# Patient Record
Sex: Female | Born: 1955
Health system: Southern US, Community
[De-identification: ages and names within clinical notes are randomized; demographics above are authoritative.]

## PROBLEM LIST (undated history)

## (undated) DIAGNOSIS — S62102A Fracture of unspecified carpal bone, left wrist, initial encounter for closed fracture: Secondary | ICD-10-CM

## (undated) DIAGNOSIS — T7840XA Allergy, unspecified, initial encounter: Secondary | ICD-10-CM

## (undated) DIAGNOSIS — E039 Hypothyroidism, unspecified: Secondary | ICD-10-CM

## (undated) DIAGNOSIS — L309 Dermatitis, unspecified: Secondary | ICD-10-CM

## (undated) HISTORY — DX: Dermatitis, unspecified: L30.9

## (undated) HISTORY — PX: ANKLE SURGERY: SHX546

## (undated) HISTORY — PX: TUBAL LIGATION: SHX77

## (undated) HISTORY — DX: Hypothyroidism, unspecified: E03.9

## (undated) HISTORY — DX: Allergy, unspecified, initial encounter: T78.40XA

---

## 1999-01-20 ENCOUNTER — Ambulatory Visit (HOSPITAL_COMMUNITY): Admission: RE | Admit: 1999-01-20 | Discharge: 1999-01-21 | Payer: Self-pay | Admitting: Orthopaedic Surgery

## 1999-12-31 ENCOUNTER — Encounter: Admission: RE | Admit: 1999-12-31 | Discharge: 1999-12-31 | Payer: Self-pay | Admitting: Internal Medicine

## 1999-12-31 ENCOUNTER — Encounter: Payer: Self-pay | Admitting: Internal Medicine

## 2001-01-05 ENCOUNTER — Encounter: Admission: RE | Admit: 2001-01-05 | Discharge: 2001-01-05 | Payer: Self-pay | Admitting: Internal Medicine

## 2001-01-05 ENCOUNTER — Encounter: Payer: Self-pay | Admitting: Internal Medicine

## 2001-03-02 ENCOUNTER — Other Ambulatory Visit: Admission: RE | Admit: 2001-03-02 | Discharge: 2001-03-02 | Payer: Self-pay | Admitting: Internal Medicine

## 2002-02-01 ENCOUNTER — Encounter: Admission: RE | Admit: 2002-02-01 | Discharge: 2002-02-01 | Payer: Self-pay | Admitting: Internal Medicine

## 2002-02-01 ENCOUNTER — Encounter: Payer: Self-pay | Admitting: Internal Medicine

## 2003-06-27 ENCOUNTER — Encounter: Admission: RE | Admit: 2003-06-27 | Discharge: 2003-06-27 | Payer: Self-pay | Admitting: Internal Medicine

## 2004-01-03 ENCOUNTER — Other Ambulatory Visit: Admission: RE | Admit: 2004-01-03 | Discharge: 2004-01-03 | Payer: Self-pay | Admitting: Obstetrics and Gynecology

## 2004-06-27 ENCOUNTER — Ambulatory Visit (HOSPITAL_COMMUNITY): Admission: RE | Admit: 2004-06-27 | Discharge: 2004-06-27 | Payer: Self-pay | Admitting: Internal Medicine

## 2004-09-08 ENCOUNTER — Ambulatory Visit: Payer: Self-pay | Admitting: Internal Medicine

## 2005-01-05 ENCOUNTER — Ambulatory Visit: Payer: Self-pay | Admitting: Internal Medicine

## 2005-01-24 ENCOUNTER — Ambulatory Visit: Payer: Self-pay | Admitting: Family Medicine

## 2005-02-11 ENCOUNTER — Other Ambulatory Visit: Admission: RE | Admit: 2005-02-11 | Discharge: 2005-02-11 | Payer: Self-pay | Admitting: Obstetrics and Gynecology

## 2005-04-23 ENCOUNTER — Ambulatory Visit: Payer: Self-pay | Admitting: Internal Medicine

## 2005-05-18 ENCOUNTER — Ambulatory Visit: Payer: Self-pay | Admitting: Internal Medicine

## 2005-06-29 ENCOUNTER — Ambulatory Visit (HOSPITAL_COMMUNITY): Admission: RE | Admit: 2005-06-29 | Discharge: 2005-06-29 | Payer: Self-pay | Admitting: Internal Medicine

## 2005-07-24 ENCOUNTER — Ambulatory Visit: Payer: Self-pay | Admitting: Internal Medicine

## 2005-07-24 ENCOUNTER — Encounter: Payer: Self-pay | Admitting: Family Medicine

## 2005-12-25 ENCOUNTER — Ambulatory Visit: Payer: Self-pay | Admitting: Internal Medicine

## 2006-06-30 ENCOUNTER — Ambulatory Visit (HOSPITAL_COMMUNITY): Admission: RE | Admit: 2006-06-30 | Discharge: 2006-06-30 | Payer: Self-pay | Admitting: Internal Medicine

## 2007-05-13 ENCOUNTER — Ambulatory Visit: Payer: Self-pay | Admitting: Internal Medicine

## 2007-07-04 ENCOUNTER — Ambulatory Visit (HOSPITAL_COMMUNITY): Admission: RE | Admit: 2007-07-04 | Discharge: 2007-07-04 | Payer: Self-pay | Admitting: Internal Medicine

## 2008-01-28 ENCOUNTER — Ambulatory Visit: Payer: Self-pay | Admitting: Family Medicine

## 2008-01-30 ENCOUNTER — Telehealth: Payer: Self-pay | Admitting: Internal Medicine

## 2008-02-20 ENCOUNTER — Ambulatory Visit: Payer: Self-pay | Admitting: Internal Medicine

## 2008-02-20 DIAGNOSIS — H60399 Other infective otitis externa, unspecified ear: Secondary | ICD-10-CM | POA: Insufficient documentation

## 2008-02-20 DIAGNOSIS — J309 Allergic rhinitis, unspecified: Secondary | ICD-10-CM | POA: Insufficient documentation

## 2008-02-20 DIAGNOSIS — M810 Age-related osteoporosis without current pathological fracture: Secondary | ICD-10-CM

## 2008-03-05 ENCOUNTER — Ambulatory Visit: Payer: Self-pay | Admitting: Internal Medicine

## 2008-03-05 DIAGNOSIS — J069 Acute upper respiratory infection, unspecified: Secondary | ICD-10-CM | POA: Insufficient documentation

## 2008-04-13 ENCOUNTER — Ambulatory Visit: Payer: Self-pay | Admitting: Internal Medicine

## 2008-04-26 ENCOUNTER — Telehealth: Payer: Self-pay | Admitting: Internal Medicine

## 2008-10-05 ENCOUNTER — Ambulatory Visit: Payer: Self-pay | Admitting: Family Medicine

## 2008-10-05 DIAGNOSIS — H60541 Acute eczematoid otitis externa, right ear: Secondary | ICD-10-CM | POA: Insufficient documentation

## 2008-10-25 ENCOUNTER — Ambulatory Visit: Payer: Self-pay | Admitting: Family Medicine

## 2008-10-25 DIAGNOSIS — H01009 Unspecified blepharitis unspecified eye, unspecified eyelid: Secondary | ICD-10-CM | POA: Insufficient documentation

## 2009-02-22 ENCOUNTER — Ambulatory Visit: Payer: Self-pay | Admitting: Family Medicine

## 2009-05-28 ENCOUNTER — Encounter (INDEPENDENT_AMBULATORY_CARE_PROVIDER_SITE_OTHER): Payer: Self-pay | Admitting: *Deleted

## 2009-06-19 ENCOUNTER — Encounter (INDEPENDENT_AMBULATORY_CARE_PROVIDER_SITE_OTHER): Payer: Self-pay | Admitting: *Deleted

## 2009-07-12 ENCOUNTER — Encounter (INDEPENDENT_AMBULATORY_CARE_PROVIDER_SITE_OTHER): Payer: Self-pay | Admitting: *Deleted

## 2009-07-15 ENCOUNTER — Ambulatory Visit: Payer: Self-pay | Admitting: Gastroenterology

## 2009-09-16 ENCOUNTER — Encounter (INDEPENDENT_AMBULATORY_CARE_PROVIDER_SITE_OTHER): Payer: Self-pay | Admitting: *Deleted

## 2009-09-16 ENCOUNTER — Ambulatory Visit: Payer: Self-pay | Admitting: Internal Medicine

## 2009-09-30 ENCOUNTER — Ambulatory Visit: Payer: Self-pay | Admitting: Internal Medicine

## 2009-09-30 HISTORY — PX: COLONOSCOPY: SHX174

## 2010-04-25 ENCOUNTER — Ambulatory Visit: Payer: Self-pay | Admitting: Family Medicine

## 2010-04-25 DIAGNOSIS — J209 Acute bronchitis, unspecified: Secondary | ICD-10-CM

## 2010-09-04 NOTE — Miscellaneous (Signed)
Summary: [facility] PV prep  Clinical Lists Changes  Observations: Added new observation of ALLERGY REV: Done (09/16/2009 10:46)  Pt had a previously scheduled colonoscopy that she cancelled and did not use MoviPrep.  She will use prep she already has at home.

## 2010-09-04 NOTE — Procedures (Signed)
Summary: Colonoscopy  Patient: Christy Douglas Note: All result statuses are Final unless otherwise noted.  Tests: (1) Colonoscopy (COL)   COL Colonoscopy           DONE     [facility]     [address]     [place], [place]  [address]           COLONOSCOPY PROCEDURE REPORT           PATIENT:  [name], [name]  MR#:  [id number]     BIRTHDATE:  02-21-1956, 53 yrs. old  GENDER:  female           ENDOSCOPIST:  Hedwig Morton. Juanda Chance, MD     Referred by:  Tera Mater Clent Ridges, M.D.           PROCEDURE DATE:  09/30/2009     PROCEDURE:  Colonoscopy 295[address]     ASA CLASS:  Class I     INDICATIONS:  Routine Risk Screening           MEDICATIONS:   There was residual sedation effect present from     prior procedure. 10 mg, Fentanyl 100 mcg           DESCRIPTION OF PROCEDURE:   After the risks benefits and     alternatives of the procedure were thoroughly explained, informed     consent was obtained.  No rectal exam performed. The LB CF-H180AL     [id number] endoscope was introduced through the anus and advanced to     the cecum, which was identified by both the appendix and ileocecal     valve, without limitations.  The quality of the prep was good,     using MiraLax.  The instrument was then slowly withdrawn as the     colon was fully examined.     <<PROCEDUREIMAGES>>           FINDINGS:  No polyps or cancers were seen (see image1, image2, and     image3).   Retroflexed views in the rectum revealed no     abnormalities.    The scope was then withdrawn from the patient     and the procedure completed.           COMPLICATIONS:  None           ENDOSCOPIC IMPRESSION:     1) No polyps or cancers     2) Normal colonoscopy     RECOMMENDATIONS:     1) High fiber diet.           REPEAT EXAM:  In 10 year(s) for.           ______________________________     Hedwig Morton. Juanda Chance, MD           CC:           n.     eSIGNED:   Hedwig Morton. Nabria Nevin at 09/30/2009 10:05 AM           Lillard Anes, [id number]  Note:  An exclamation mark (!) indicates a result that was not dispersed into the flowsheet. Document Creation Date: 09/30/2009 10:06 AM _______________________________________________________________________  (1) Order result status: Final Collection or observation date-time: 09/30/2009 09:56 Requested date-time:  Receipt date-time:  Reported date-time:  Referring Physician:   Ordering Physician: Lina Sar [id number]) Specimen Source:  Source: Launa Grill Order Number: [id number] Lab site:   Appended Document: Colonoscopy    Clinical Lists Changes  Observations: Added new observation  of COLONNXTDUE: 09/2019 (09/30/2009 10:45)

## 2010-09-04 NOTE — Letter (Signed)
Summary: [facility] Instructions  [facility]  [address] [place], [place] [address]   Phone: [phone]  Fax: [phone]       Christy Douglas    Mar 17, 1956    MRN: [id number]        Procedure Day /Date:  Monday 09/30/2009     Arrival Time: 8:30 am      Procedure Time: 9:30 am     Location of Procedure:                    _ x_  [facility] (4th Floor)                        PREPARATION FOR COLONOSCOPY WITH MOVIPREP   Starting 5 days prior to your procedure Wednesday 2/23 do not eat nuts, seeds, popcorn, corn, beans, peas,  salads, or any raw vegetables.  Do not take any fiber supplements (e.g. Metamucil, Citrucel, and Benefiber).  THE DAY BEFORE YOUR PROCEDURE         DATE: Sunday 2/27  1.  Drink clear liquids the entire day-NO SOLID FOOD  2.  Do not drink anything colored red or purple.  Avoid juices with pulp.  No orange juice.  3.  Drink at least 64 oz. (8 glasses) of fluid/clear liquids during the day to prevent dehydration and help the prep work efficiently.  CLEAR LIQUIDS INCLUDE: Water Jello Ice Popsicles Tea (sugar ok, no milk/cream) Powdered fruit flavored drinks Coffee (sugar ok, no milk/cream) Gatorade Juice: apple, white grape, white cranberry  Lemonade Clear bullion, consomm, broth Carbonated beverages (any kind) Strained chicken noodle soup Hard Candy                             4.  In the morning, mix first dose of MoviPrep solution:    Empty 1 Pouch A and 1 Pouch B into the disposable container    Add lukewarm drinking water to the top line of the container. Mix to dissolve    Refrigerate (mixed solution should be used within 24 hrs)  5.  Begin drinking the prep at 5:00 p.m. The MoviPrep container is divided by 4 marks.   Every 15 minutes drink the solution down to the next mark (approximately 8 oz) until the full liter is complete.   6.  Follow completed prep with 16 oz of clear liquid of your choice (Nothing red  or purple).  Continue to drink clear liquids until bedtime.  7.  Before going to bed, mix second dose of MoviPrep solution:    Empty 1 Pouch A and 1 Pouch B into the disposable container    Add lukewarm drinking water to the top line of the container. Mix to dissolve    Refrigerate  THE DAY OF YOUR PROCEDURE      DATE: Monday 2/28  Beginning at 4:30 am (5 hours before procedure):         1. Every 15 minutes, drink the solution down to the next mark (approx 8 oz) until the full liter is complete.  2. Follow completed prep with 16 oz. of clear liquid of your choice.    3. You may drink clear liquids until 7:j30 am (2 HOURS BEFORE PROCEDURE).   MEDICATION INSTRUCTIONS  Unless otherwise instructed, you should take regular prescription medications with a small sip of water   as early as possible the morning of  your procedure.  Diabetic patients - see separate instructions.  Stop taking Plavix or Aggrenox on  _  _  (7 days before procedure).     Stop taking Coumadin on  _ _  (5 days before procedure).  Additional medication instructions: _         OTHER INSTRUCTIONS  You will need a responsible adult at least 53 years of age to accompany you and drive you home.   This person must remain in the waiting room during your procedure.  Wear loose fitting clothing that is easily removed.  Leave jewelry and other valuables at home.  However, you may wish to bring a book to read or  an iPod/MP3 player to listen to music as you wait for your procedure to start.  Remove all body piercing jewelry and leave at home.  Total time from sign-in until discharge is approximately 2-3 hours.  You should go home directly after your procedure and rest.  You can resume normal activities the  day after your procedure.  The day of your procedure you should not:   Drive   Make legal decisions   Operate machinery   Drink alcohol   Return to work  You will receive specific  instructions about eating, activities and medications before you leave.    The above instructions have been reviewed and explained to me by   _______________________    I fully understand and can verbalize these instructions _____________________________ Date _________
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
## 2010-09-04 NOTE — Letter (Signed)
Summary: [employer] / [employer] [employer] / [employer]   Imported By: Lennie Odor 07/22/2010 15:21:40  _____________________________________________________________________  External Attachment:    Type:   Image     Comment:   External Document

## 2011-01-02 ENCOUNTER — Encounter: Payer: Self-pay | Admitting: Family Medicine

## 2011-01-02 ENCOUNTER — Ambulatory Visit (INDEPENDENT_AMBULATORY_CARE_PROVIDER_SITE_OTHER): Payer: Medicare Other | Admitting: Family Medicine

## 2011-01-02 VITALS — BP 118/78 | Temp 98.5°F | Ht 64.0 in | Wt 168.0 lb

## 2011-01-02 DIAGNOSIS — H6120 Impacted cerumen, unspecified ear: Secondary | ICD-10-CM

## 2011-01-02 DIAGNOSIS — H60399 Other infective otitis externa, unspecified ear: Secondary | ICD-10-CM

## 2011-01-02 DIAGNOSIS — H6090 Unspecified otitis externa, unspecified ear: Secondary | ICD-10-CM

## 2011-01-02 MED ORDER — NEOMYCIN-POLYMYXIN-HC 3.5-10000-1 OT SUSP: 4.0000 [drp] | Freq: Four times a day (QID) | OTIC | Status: AC

## 2011-01-02 NOTE — Progress Notes (Signed)
  Subjective:    Patient ID: Christy Douglas, female    DOB: 25-Jan-1956, 55 y.o.   MRN: [id number]  HPI She went swimming 4 days ago as she usually does, but immediately after that she felt her right ear stop up. She has felt pressure in it ever since, and her hearing is muffled. No pain or fever.    Review of Systems  Constitutional: Negative.   HENT: Positive for hearing loss, congestion and sinus pressure. Negative for ear pain.   Eyes: Negative.   Respiratory: Negative.        Objective:   Physical Exam  Constitutional: She appears well-developed and well-nourished.  HENT:  Left Ear: External ear normal.  Nose: Nose normal.  Mouth/Throat: Oropharynx is clear and moist. No oropharyngeal exudate.       The right ear canal is full of cerumen   Eyes: Conjunctivae are normal. Pupils are equal, round, and reactive to light.  Neck: Normal range of motion. Neck supple. No thyromegaly present.  Pulmonary/Chest: Effort normal and breath sounds normal.  Lymphadenopathy:    She has no cervical adenopathy.          Assessment & Plan:  Stay out of the pool for one week.

## 2011-01-29 ENCOUNTER — Encounter: Payer: Self-pay | Admitting: Family Medicine

## 2011-01-29 ENCOUNTER — Ambulatory Visit (INDEPENDENT_AMBULATORY_CARE_PROVIDER_SITE_OTHER): Payer: Medicare Other | Admitting: Family Medicine

## 2011-01-29 VITALS — BP 110/80 | Temp 98.0°F

## 2011-01-29 DIAGNOSIS — H6090 Unspecified otitis externa, unspecified ear: Secondary | ICD-10-CM

## 2011-01-29 DIAGNOSIS — H60399 Other infective otitis externa, unspecified ear: Secondary | ICD-10-CM

## 2011-01-29 MED ORDER — NEOMYCIN-POLYMYXIN-HC 3.5-10000-1 OT SUSP: 4.0000 [drp] | Freq: Four times a day (QID) | OTIC | Status: DC

## 2011-01-29 NOTE — Progress Notes (Signed)
  Subjective:    Patient ID: Christy Douglas, female    DOB: 10-07-1955, 55 y.o.   MRN: [id number]  HPI Here for 2 days of pain in the left ear. She was here 4 weeks ago for the same problem in the right ear. This resolved with Cortisporin Otic. She still swims 4 days a week. No fever.    Review of Systems  Constitutional: Negative.   HENT: Positive for ear pain. Negative for hearing loss, congestion, sneezing, neck pain, neck stiffness, postnasal drip, sinus pressure and ear discharge.   Respiratory: Negative.        Objective:   Physical Exam  Constitutional: She appears well-developed and well-nourished.  HENT:  Head: Normocephalic and atraumatic.  Right Ear: External ear normal.  Nose: Nose normal.  Mouth/Throat: Oropharynx is clear and moist. No oropharyngeal exudate.  Eyes: Conjunctivae are normal. Pupils are equal, round, and reactive to light.       The left ear canal is red and swollen. The TM is clear   Neck: No thyromegaly present.  Lymphadenopathy:    She has no cervical adenopathy.          Assessment & Plan:  Use Cortisporin Otic again. No swimming for one week.

## 2011-03-09 ENCOUNTER — Telehealth: Payer: Self-pay | Admitting: *Deleted

## 2011-03-09 NOTE — Telephone Encounter (Signed)
Cal in one double pack of EpiPens to use prn

## 2011-03-09 NOTE — Telephone Encounter (Signed)
Would like an Epipen to take to [place] with her as she had mutltiple allergies, and is nervous about being in a different area, etc.

## 2011-03-10 MED ORDER — EPINEPHRINE 0.3 MG/0.3ML IJ DEVI: 0.3000 mg | Freq: Once | INTRAMUSCULAR | Status: DC

## 2011-03-10 NOTE — Telephone Encounter (Signed)
rx efiled to [facility] and pt aware

## 2011-09-09 ENCOUNTER — Encounter: Payer: Self-pay | Admitting: Family Medicine

## 2011-09-09 ENCOUNTER — Ambulatory Visit (INDEPENDENT_AMBULATORY_CARE_PROVIDER_SITE_OTHER): Payer: Medicare Other | Admitting: Family Medicine

## 2011-09-09 VITALS — BP 112/70 | HR 82 | Temp 98.6°F | Wt 164.0 lb

## 2011-09-09 DIAGNOSIS — J329 Chronic sinusitis, unspecified: Secondary | ICD-10-CM

## 2011-09-09 MED ORDER — AZITHROMYCIN 250 MG PO TABS: ORAL_TABLET | ORAL | Status: AC

## 2011-09-09 NOTE — Progress Notes (Signed)
  Subjective:    Patient ID: Christy Douglas, female    DOB: 05-08-1956, 56 y.o.   MRN: [id number]  HPI Here for 10 days of sinus pressure, PND, ST, and a dry cough. No fever.    Review of Systems  Constitutional: Negative.   HENT: Positive for congestion, postnasal drip and sinus pressure.   Respiratory: Positive for cough.        Objective:   Physical Exam  Constitutional: She appears well-developed and well-nourished.  HENT:  Right Ear: External ear normal.  Left Ear: External ear normal.  Nose: Nose normal.  Mouth/Throat: Oropharynx is clear and moist. No oropharyngeal exudate.  Eyes: Conjunctivae and EOM are normal.  Neck: No thyromegaly present.  Pulmonary/Chest: Effort normal and breath sounds normal.  Lymphadenopathy:    She has no cervical adenopathy.          Assessment & Plan:  Add Mucinex and drink water

## 2011-10-02 ENCOUNTER — Telehealth: Payer: Self-pay | Admitting: Family Medicine

## 2011-10-02 MED ORDER — TACROLIMUS 0.1 % EX OINT: TOPICAL_OINTMENT | Freq: Two times a day (BID) | CUTANEOUS | Status: DC

## 2011-10-02 NOTE — Telephone Encounter (Signed)
Call in this in please, apply bid prn, one year supply

## 2011-10-02 NOTE — Telephone Encounter (Signed)
Script sent e-scribe 

## 2011-10-02 NOTE — Telephone Encounter (Signed)
Protopic ointment 0.1% - needs refill for this- this for eyelids//[facility] [address]

## 2011-10-16 ENCOUNTER — Telehealth: Payer: Self-pay | Admitting: Family Medicine

## 2011-10-16 NOTE — Telephone Encounter (Signed)
Pt call back stating she needs PA for tacrolimus ointment humana [phone]

## 2011-10-16 NOTE — Telephone Encounter (Addendum)
Pt would like a refill on tacrolimus ointment 0.1% call int [facility] [address] [phone]

## 2011-10-19 MED ORDER — PIMECROLIMUS 1 % EX CREA: TOPICAL_CREAM | Freq: Two times a day (BID) | CUTANEOUS | Status: DC

## 2011-10-19 NOTE — Telephone Encounter (Signed)
Rx sent to pharmacy for Elidel ointment.

## 2011-10-19 NOTE — Telephone Encounter (Signed)
Please change this to Elidel ointment. Apply bid prn , 90 days supply with 3 rf

## 2011-10-19 NOTE — Telephone Encounter (Signed)
Dr. Clent Ridges,  The prior auth for Protopic has been denied. Per [facility], pt must first try and fail the preferred drug, which is Elidel. Please have Nettie Elm send in this Rx, or let me know if there is a reason the pt cannot use Elidel. Thank you.

## 2011-10-19 NOTE — Telephone Encounter (Signed)
Prior Auth denied. Sent message to Dr. Clent Ridges that pt must try a preferred Rx first - in this case, Elidel. Waiting on new Rx.

## 2011-10-19 NOTE — Telephone Encounter (Signed)
Addended by: Azucena Freed on: 10/19/2011 02:09 PM   Modules accepted: Orders

## 2011-12-21 ENCOUNTER — Telehealth: Payer: Self-pay | Admitting: Family Medicine

## 2011-12-21 NOTE — Telephone Encounter (Signed)
Pt is requesting a new script so that she can get a new brace for her foot. The one she has is too short and it is rubbing, which is causing some discomfort. The script must come from a doctor's office. The name of company is [facility] phone [phone] and fax # is [phone]. Please call pt if and when this is done.

## 2011-12-22 NOTE — Telephone Encounter (Signed)
I do not write for braces like this. She needs to ask the doctor who originally gave it to her.

## 2011-12-22 NOTE — Telephone Encounter (Signed)
Spoke with pt

## 2012-01-15 ENCOUNTER — Ambulatory Visit (INDEPENDENT_AMBULATORY_CARE_PROVIDER_SITE_OTHER): Payer: Medicare Other | Admitting: Family Medicine

## 2012-01-15 ENCOUNTER — Encounter: Payer: Self-pay | Admitting: Family Medicine

## 2012-01-15 VITALS — BP 122/74 | HR 67 | Temp 97.8°F | Wt 156.0 lb

## 2012-01-15 DIAGNOSIS — L259 Unspecified contact dermatitis, unspecified cause: Secondary | ICD-10-CM

## 2012-01-15 DIAGNOSIS — L309 Dermatitis, unspecified: Secondary | ICD-10-CM

## 2012-01-15 MED ORDER — TRIAMCINOLONE ACETONIDE 0.025 % EX CREA: 2.0000 "application " | TOPICAL_CREAM | Freq: Two times a day (BID) | CUTANEOUS | Status: DC

## 2012-01-15 MED ORDER — EPINEPHRINE 0.3 MG/0.3ML IJ DEVI: 0.3000 mg | Freq: Once | INTRAMUSCULAR | Status: DC

## 2012-01-15 MED ORDER — HALOBETASOL PROPIONATE 0.05 % EX CREA: TOPICAL_CREAM | Freq: Two times a day (BID) | CUTANEOUS | Status: DC

## 2012-01-18 ENCOUNTER — Encounter: Payer: Self-pay | Admitting: Family Medicine

## 2012-01-18 NOTE — Progress Notes (Signed)
  Subjective:    Patient ID: Christy Douglas, female    DOB: [birth year], 56 y.o.   MRN: [id number]  HPI Here for 3 days of an itchy rash on the right arm. She has eczema, but the triamcinolone cream she has at home has not helped.    Review of Systems  Constitutional: Negative.   Skin: Positive for rash.       Objective:   Physical Exam  Constitutional: She appears well-developed and well-nourished.  Skin:       The right forearm has an area of macular scaly erythema           Assessment & Plan:  Try Ultravate cream

## 2012-02-26 ENCOUNTER — Other Ambulatory Visit: Payer: Self-pay | Admitting: Family Medicine

## 2012-02-26 ENCOUNTER — Telehealth: Payer: Self-pay | Admitting: Family Medicine

## 2012-02-26 MED ORDER — NEOMYCIN-POLYMYXIN-HC 3.5-10000-1 OT SUSP: 4.0000 [drp] | Freq: Four times a day (QID) | OTIC | Status: DC

## 2012-02-26 NOTE — Telephone Encounter (Signed)
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  Keeps a script for Cortisporin Otic 1% sus generic on hand.  Has 3 refills left but they expired on 01/29/12.  Developed itching and pain of bilateral ear canals on 02/22/12.  Requesting for refill of ear gtts.  Afebrile.  Denies discharge from ear canal.  Utilized Ear: Symptoms Guideline.  Home care advice disposition.  Please f/u with T[facility]ala regarding refill request.  [facility]: [facility] [phone].

## 2012-02-26 NOTE — Telephone Encounter (Signed)
Pt informed on VM rx ordered and sent to [facility]

## 2012-02-26 NOTE — Telephone Encounter (Signed)
Call in Cortisporin Otic susp as above, 10 ml with no refills

## 2012-06-14 ENCOUNTER — Ambulatory Visit (INDEPENDENT_AMBULATORY_CARE_PROVIDER_SITE_OTHER): Payer: Medicare Other | Admitting: Family Medicine

## 2012-06-14 ENCOUNTER — Encounter: Payer: Self-pay | Admitting: Family Medicine

## 2012-06-14 VITALS — BP 112/80 | HR 76 | Temp 97.9°F | Wt 165.0 lb

## 2012-06-14 DIAGNOSIS — H01009 Unspecified blepharitis unspecified eye, unspecified eyelid: Secondary | ICD-10-CM

## 2012-06-14 NOTE — Progress Notes (Signed)
  Subjective:    Patient ID: Christy Douglas, female    DOB: Mar 01, 1956, 56 y.o.   MRN: [id number]  HPI Here for 4 days of redness, itching, and swelling of the eyelids bilaterally. No red eyes or crusting. No URI symptoms. This started after she spent several hours blowing leaves in her yard last weekend. She has several creams at home but does not know which one to try for this. Using an OTC moisturizer right now.    Review of Systems  Constitutional: Negative.   HENT: Positive for facial swelling.   Eyes: Positive for itching. Negative for photophobia, pain, discharge, redness and visual disturbance.       Objective:   Physical Exam  Constitutional: She appears well-developed and well-nourished.  Eyes: Conjunctivae normal and EOM are normal. Pupils are equal, round, and reactive to light. Right eye exhibits no discharge. Left eye exhibits no discharge.       The upper and lower eyelids on both sides are swollen and red          Assessment & Plan:  Advised her to use the Halobetasol cream bid for several days. Recheck prn

## 2012-08-10 ENCOUNTER — Ambulatory Visit (INDEPENDENT_AMBULATORY_CARE_PROVIDER_SITE_OTHER): Payer: Medicare Other | Admitting: Family Medicine

## 2012-08-10 ENCOUNTER — Encounter: Payer: Self-pay | Admitting: Family Medicine

## 2012-08-10 ENCOUNTER — Telehealth: Payer: Self-pay | Admitting: Family Medicine

## 2012-08-10 VITALS — BP 120/78 | HR 79 | Temp 98.2°F | Wt 168.0 lb

## 2012-08-10 DIAGNOSIS — J40 Bronchitis, not specified as acute or chronic: Secondary | ICD-10-CM

## 2012-08-10 DIAGNOSIS — H1130 Conjunctival hemorrhage, unspecified eye: Secondary | ICD-10-CM

## 2012-08-10 MED ORDER — LEVOFLOXACIN 500 MG PO TABS: 500.0000 mg | ORAL_TABLET | Freq: Every day | ORAL | Status: AC

## 2012-08-10 NOTE — Progress Notes (Signed)
  Subjective:    Patient ID: Christy Douglas, female    DOB: 12-15-1955, 57 y.o.   MRN: [id number]  HPI Here for 2 weeks of sinus pressure, PND, ST, and coughing up green sputum. No fever. Also yesterday she noticed redness in the right eye. No pain or vision problems.    Review of Systems  Constitutional: Negative.   HENT: Positive for congestion, postnasal drip and sinus pressure. Negative for ear pain.   Eyes: Positive for redness. Negative for photophobia, pain, discharge, itching and visual disturbance.  Respiratory: Positive for cough.        Objective:   Physical Exam  Constitutional: She appears well-developed and well-nourished.  HENT:  Right Ear: External ear normal.  Left Ear: External ear normal.  Nose: Nose normal.  Mouth/Throat: Oropharynx is clear and moist. No oropharyngeal exudate.  Eyes: Pupils are equal, round, and reactive to light. Right eye exhibits no discharge. Left eye exhibits no discharge.       Left eye is clear. The right eye has a subconjunctival hemorrhage on the nasal half, cornea is clear   Neck: Neck supple. No thyromegaly present.  Pulmonary/Chest: Effort normal. No respiratory distress. She has no wheezes. She has no rales.       Scattered rhonchi   Lymphadenopathy:    She has no cervical adenopathy.          Assessment & Plan:  She has bronchitis and the subconjunctival hemorrhage probably resulted form the coughing. I reassured her this is benign and self limited. Given Levaquin.

## 2012-08-10 NOTE — Telephone Encounter (Signed)
Patient calling and states her mother had surgery 1-8. Patient was in office 1-8 and diagnosed with bronchitis. Is wanting to know if she is contagious. Advised respiratory hygiene if around her Mom. Wants to know how long takes for antibiotic to help with contagiousness. Advised 24 hours.

## 2012-08-11 NOTE — Telephone Encounter (Signed)
She should use basic handwashing around her mother. Antibiotics take about 3 days to kick in and reduced contagiousness

## 2012-08-12 NOTE — Telephone Encounter (Signed)
I spoke with pt  

## 2012-08-16 ENCOUNTER — Telehealth: Payer: Self-pay | Admitting: Family Medicine

## 2012-08-16 NOTE — Telephone Encounter (Signed)
Patient Information:  Caller Name: Billye  Phone: [phone]  Patient: Christy Douglas  Gender: Female  DOB: 30-Aug-1955  Age: 57 Years  PCP: Gershon Crane [facility])  Pregnant: No  Office Follow Up:  Does the office need to follow up with this patient?: No  Instructions For The Office: N/A  RN Note:  Seen 08/10/12.  States taking levaquin; has 3 more days of antibiotic.  States her cough is exacerbated by construction dust.  Denies wheezing or shortness of breath.  Taking allegra and zantac OTC with some improvement.  Per cough protocol, emergent symptoms denied; advised for home care, with callback parameters given.  krs/can  Symptoms  Reason For Call & Symptoms: bronchial infection and took levaquin.  States since she was seen, she continues to have a cough.  There is construction in the house.  Reviewed Health History In EMR: Yes  Reviewed Medications In EMR: Yes  Reviewed Allergies In EMR: Yes  Reviewed Surgeries / Procedures: Yes  Date of Onset of Symptoms: Unknown OB / GYN:  LMP: Unknown  Guideline(s) Used:  Cough  Disposition Per Guideline:   Home Care  Reason For Disposition Reached:   Cough with no complications  Advice Given:  N/A

## 2012-09-29 ENCOUNTER — Other Ambulatory Visit: Payer: Self-pay | Admitting: Family Medicine

## 2012-09-29 NOTE — Telephone Encounter (Signed)
Okay to fill? 

## 2013-02-01 ENCOUNTER — Encounter: Payer: Self-pay | Admitting: Family Medicine

## 2013-02-01 ENCOUNTER — Ambulatory Visit (INDEPENDENT_AMBULATORY_CARE_PROVIDER_SITE_OTHER): Payer: Medicare Other | Admitting: Family Medicine

## 2013-02-01 VITALS — BP 120/76 | HR 74 | Temp 98.2°F | Ht 63.5 in | Wt 166.0 lb

## 2013-02-01 DIAGNOSIS — E039 Hypothyroidism, unspecified: Secondary | ICD-10-CM

## 2013-02-01 LAB — CBC WITH DIFFERENTIAL/PLATELET
Basophils Relative: 0.7 % (ref 0.0–3.0)
Eosinophils Absolute: 0.2 10*3/uL (ref 0.0–0.7)
Eosinophils Relative: 3.6 % (ref 0.0–5.0)
Hemoglobin: 14.4 g/dL (ref 12.0–15.0)
Lymphocytes Relative: 29 % (ref 12.0–46.0)
MCHC: 34 g/dL (ref 30.0–36.0)
Monocytes Absolute: 0.5 10*3/uL (ref 0.1–1.0)
Neutro Abs: 3.3 10*3/uL (ref 1.4–7.7)
Neutrophils Relative %: 58.2 % (ref 43.0–77.0)
RBC: 4.7 Mil/uL (ref 3.87–5.11)
RDW: 13.5 % (ref 11.5–14.6)

## 2013-02-01 LAB — BASIC METABOLIC PANEL
CO2: 27 mEq/L (ref 19–32)
Calcium: 10.8 mg/dL — ABNORMAL HIGH (ref 8.4–10.5)
Chloride: 105 mEq/L (ref 96–112)
Creatinine, Ser: 0.7 mg/dL (ref 0.4–1.2)
Glucose, Bld: 108 mg/dL — ABNORMAL HIGH (ref 70–99)
Potassium: 4.1 mEq/L (ref 3.5–5.1)
Sodium: 139 mEq/L (ref 135–145)

## 2013-02-01 LAB — LIPID PANEL
LDL Cholesterol: 114 mg/dL — ABNORMAL HIGH (ref 0–99)
Total CHOL/HDL Ratio: 3
VLDL: 19.6 mg/dL (ref 0.0–40.0)

## 2013-02-01 LAB — HEPATIC FUNCTION PANEL
ALT: 32 U/L (ref 0–35)
Albumin: 4.3 g/dL (ref 3.5–5.2)
Total Protein: 8 g/dL (ref 6.0–8.3)

## 2013-02-01 LAB — TSH: TSH: 1.67 u[IU]/mL (ref 0.35–5.50)

## 2013-02-01 LAB — POCT URINALYSIS DIPSTICK
Glucose, UA: NEGATIVE
Leukocytes, UA: NEGATIVE
Spec Grav, UA: 1.015
Urobilinogen, UA: 0.2
pH, UA: 7.5

## 2013-02-01 NOTE — Progress Notes (Signed)
  Subjective:    Patient ID: SARAN LAVIOLETTE, female    DOB: [birth year], 57 y.o.   MRN: [id number]  HPI 57 yr old female for a cpx. She feels well but is concerned about her inability to lose weight despite watching her diet and exercising 5 days a week. She has been treated for hypothyroidism by her GYN, Dr. Henderson Cloud, for about 5 years, but she says she has not had any blood tests for years.    Review of Systems  Constitutional: Negative.   HENT: Negative.   Eyes: Negative.   Respiratory: Negative.   Cardiovascular: Negative.   Gastrointestinal: Negative.   Genitourinary: Negative for dysuria, urgency, frequency, hematuria, flank pain, decreased urine volume, enuresis, difficulty urinating, pelvic pain and dyspareunia.  Musculoskeletal: Negative.   Skin: Negative.   Neurological: Negative.   Psychiatric/Behavioral: Negative.        Objective:   Physical Exam  Constitutional: She is oriented to person, place, and time. She appears well-developed and well-nourished. No distress.  HENT:  Head: Normocephalic and atraumatic.  Right Ear: External ear normal.  Left Ear: External ear normal.  Nose: Nose normal.  Mouth/Throat: Oropharynx is clear and moist. No oropharyngeal exudate.  Eyes: Conjunctivae and EOM are normal. Pupils are equal, round, and reactive to light. No scleral icterus.  Neck: Normal range of motion. Neck supple. No JVD present. No thyromegaly present.  Cardiovascular: Normal rate, regular rhythm, normal heart sounds and intact distal pulses.  Exam reveals no gallop and no friction rub.   No murmur heard. Pulmonary/Chest: Effort normal and breath sounds normal. No respiratory distress. She has no wheezes. She has no rales. She exhibits no tenderness.  Abdominal: Soft. Bowel sounds are normal. She exhibits no distension and no mass. There is no tenderness. There is no rebound and no guarding.  Musculoskeletal: Normal range of motion. She exhibits no edema and no tenderness.   Lymphadenopathy:    She has no cervical adenopathy.  Neurological: She is alert and oriented to person, place, and time. She has normal reflexes. No cranial nerve deficit. She exhibits normal muscle tone. Coordination normal.  Skin: Skin is warm and dry. No rash noted. No erythema.  Psychiatric: She has a normal mood and affect. Her behavior is normal. Judgment and thought content normal.          Assessment & Plan:  Well exam. I am certain her dose of Synthroid is too low. We will check a TSH today along with other labs.

## 2013-02-06 ENCOUNTER — Telehealth: Payer: Self-pay | Admitting: Family Medicine

## 2013-02-06 NOTE — Progress Notes (Signed)
Quick Note:  Left voice message with results. ______

## 2013-02-06 NOTE — Telephone Encounter (Signed)
Refill her Synthroid for one year

## 2013-02-06 NOTE — Telephone Encounter (Signed)
Pt would like to know if she should continue levothyroxine (SYNTHROID, LEVOTHROID) 25 MCG tablet.  Pt's OBGYN put pt on this, and if MD would like her to continue this med, Dr fry will need to reorder refill.  Pharm: [facility]

## 2013-02-07 MED ORDER — LEVOTHYROXINE SODIUM 25 MCG PO TABS: 25.0000 ug | ORAL_TABLET | Freq: Every day | ORAL | Status: DC

## 2013-02-07 NOTE — Telephone Encounter (Signed)
I sent script e-scribe and spoke with pt. 

## 2013-05-03 LAB — HM MAMMOGRAPHY

## 2013-06-02 ENCOUNTER — Ambulatory Visit (INDEPENDENT_AMBULATORY_CARE_PROVIDER_SITE_OTHER): Payer: Medicare Other | Admitting: Family Medicine

## 2013-06-02 ENCOUNTER — Encounter: Payer: Self-pay | Admitting: Family Medicine

## 2013-06-02 VITALS — BP 112/74 | HR 76 | Temp 98.1°F | Wt 158.0 lb

## 2013-06-02 DIAGNOSIS — T169XXA Foreign body in ear, unspecified ear, initial encounter: Secondary | ICD-10-CM

## 2013-06-02 DIAGNOSIS — H60391 Other infective otitis externa, right ear: Secondary | ICD-10-CM

## 2013-06-02 DIAGNOSIS — H60399 Other infective otitis externa, unspecified ear: Secondary | ICD-10-CM

## 2013-06-02 DIAGNOSIS — T161XXA Foreign body in right ear, initial encounter: Secondary | ICD-10-CM

## 2013-06-02 MED ORDER — NEOMYCIN-POLYMYXIN-HC 3.5-10000-1 OT SOLN: 3.0000 [drp] | Freq: Four times a day (QID) | OTIC | Status: DC

## 2013-06-02 NOTE — Progress Notes (Signed)
  Subjective:    Patient ID: Christy Douglas, female    DOB: 06-27-1956, 57 y.o.   MRN: [id number]  HPI Here for 2 days of pain in the right ear. She swims daily and uses OTC wax to keep water out of her ears.    Review of Systems  Constitutional: Negative.   HENT: Positive for ear pain.        Objective:   Physical Exam  Constitutional: She appears well-developed and well-nourished.  HENT:  Left Ear: External ear normal.  Nose: Nose normal.  Mouth/Throat: Oropharynx is clear and moist.  The right ear canal had a large piece of artificial wax present. This was removed using a speculum. Behind this the canal was red and swollen   Eyes: Conjunctivae are normal.  Lymphadenopathy:    She has no cervical adenopathy.          Assessment & Plan:  Treat with drops. I suggested she go to a hearing aid center to have molded ear inserts made for her to use while swimming

## 2013-07-04 ENCOUNTER — Encounter: Payer: Self-pay | Admitting: Family Medicine

## 2013-07-04 ENCOUNTER — Ambulatory Visit (INDEPENDENT_AMBULATORY_CARE_PROVIDER_SITE_OTHER): Payer: Medicare Other | Admitting: Family Medicine

## 2013-07-04 VITALS — BP 132/84 | HR 108 | Temp 98.7°F

## 2013-07-04 DIAGNOSIS — J019 Acute sinusitis, unspecified: Secondary | ICD-10-CM

## 2013-07-04 MED ORDER — LEVOFLOXACIN 500 MG PO TABS: 500.0000 mg | ORAL_TABLET | Freq: Every day | ORAL | Status: AC

## 2013-07-04 NOTE — Progress Notes (Signed)
Pre visit review using our clinic review tool, if applicable. No additional management support is needed unless otherwise documented below in the visit note. 

## 2013-07-04 NOTE — Progress Notes (Signed)
   Subjective:    Patient ID: Christy Douglas, female    DOB: Aug 08, 1955, 57 y.o.   MRN: [id number]  HPI Here for one week of sinus pressure, PND, ST, and a dry cough. No fever.    Review of Systems  Constitutional: Negative.   HENT: Positive for postnasal drip and sinus pressure.   Eyes: Negative.   Respiratory: Positive for cough.        Objective:   Physical Exam  Constitutional: She appears well-developed and well-nourished.  HENT:  Right Ear: External ear normal.  Left Ear: External ear normal.  Nose: Nose normal.  Mouth/Throat: Oropharynx is clear and moist.  Eyes: Conjunctivae are normal.  Pulmonary/Chest: Effort normal and breath sounds normal.  Lymphadenopathy:    She has no cervical adenopathy.          Assessment & Plan:  Add Mucinex

## 2013-11-13 ENCOUNTER — Telehealth: Payer: Self-pay | Admitting: Family Medicine

## 2013-11-13 NOTE — Telephone Encounter (Signed)
Pt is calling to see if Dr Sarajane Jews can give her an rx for tacrolimus ointment protopic it has been 5 years since she used this but she is having eyelid problems again

## 2013-11-14 NOTE — Telephone Encounter (Signed)
Call in Protopic 0.1 % ointment to apply bid prn, 15 grams with 5 rf

## 2013-11-15 MED ORDER — TACROLIMUS 0.1 % EX OINT: TOPICAL_OINTMENT | Freq: Two times a day (BID) | CUTANEOUS | Status: DC

## 2013-11-15 NOTE — Telephone Encounter (Signed)
Can you close out this chart?

## 2013-11-15 NOTE — Telephone Encounter (Signed)
I sent script e-scribe, okay per Dr. Sarajane Jews to change to the 30 gram bottle.

## 2013-11-15 NOTE — Telephone Encounter (Signed)
Pt pharm [facility] [address]

## 2013-11-15 NOTE — Telephone Encounter (Signed)
I spoke with pt  

## 2013-11-16 ENCOUNTER — Telehealth: Payer: Self-pay | Admitting: Family Medicine

## 2013-11-16 NOTE — Telephone Encounter (Signed)
Pt needs a PA on protopic  Ointment please call [phone].

## 2013-11-21 NOTE — Telephone Encounter (Signed)
PA was submitted on 11/20/13- [id number]

## 2014-02-08 ENCOUNTER — Ambulatory Visit: Payer: Medicare Other | Admitting: Family Medicine

## 2014-02-08 ENCOUNTER — Telehealth: Payer: Self-pay | Admitting: Family Medicine

## 2014-02-08 ENCOUNTER — Other Ambulatory Visit: Payer: Self-pay | Admitting: Family Medicine

## 2014-02-08 NOTE — Telephone Encounter (Signed)
Pt is needing new rx levothyroxine (SYNTHROID, LEVOTHROID) 25 MCG tablet, please send to [facility] [address] Pt has appt for phy in august.

## 2014-02-08 NOTE — Telephone Encounter (Signed)
Script was sent e-scribe 

## 2014-03-06 ENCOUNTER — Encounter: Payer: Self-pay | Admitting: Internal Medicine

## 2014-03-15 ENCOUNTER — Encounter: Payer: Self-pay | Admitting: Family Medicine

## 2014-03-15 ENCOUNTER — Ambulatory Visit (INDEPENDENT_AMBULATORY_CARE_PROVIDER_SITE_OTHER): Payer: Medicare Other | Admitting: Family Medicine

## 2014-03-15 VITALS — BP 107/77 | HR 69 | Temp 98.2°F | Ht 63.5 in | Wt 147.0 lb

## 2014-03-15 DIAGNOSIS — M216X9 Other acquired deformities of unspecified foot: Secondary | ICD-10-CM

## 2014-03-15 DIAGNOSIS — M21371 Foot drop, right foot: Secondary | ICD-10-CM | POA: Insufficient documentation

## 2014-03-15 DIAGNOSIS — Z136 Encounter for screening for cardiovascular disorders: Secondary | ICD-10-CM

## 2014-03-15 DIAGNOSIS — Z0000 Encounter for general adult medical examination without abnormal findings: Secondary | ICD-10-CM

## 2014-03-15 DIAGNOSIS — E039 Hypothyroidism, unspecified: Secondary | ICD-10-CM

## 2014-03-15 DIAGNOSIS — M810 Age-related osteoporosis without current pathological fracture: Secondary | ICD-10-CM

## 2014-03-15 LAB — CBC WITH DIFFERENTIAL/PLATELET
BASOS ABS: 0 10*3/uL (ref 0.0–0.1)
BASOS PCT: 0.5 % (ref 0.0–3.0)
EOS PCT: 2.4 % (ref 0.0–5.0)
Eosinophils Absolute: 0.2 10*3/uL (ref 0.0–0.7)
HEMATOCRIT: 42.1 % (ref 36.0–46.0)
HEMOGLOBIN: 14.1 g/dL (ref 12.0–15.0)
LYMPHS PCT: 27.5 % (ref 12.0–46.0)
Lymphs Abs: 1.7 10*3/uL (ref 0.7–4.0)
MCHC: 33.6 g/dL (ref 30.0–36.0)
MCV: 89.7 fl (ref 78.0–100.0)
MONOS PCT: 8.4 % (ref 3.0–12.0)
Monocytes Absolute: 0.5 10*3/uL (ref 0.1–1.0)
NEUTROS ABS: 3.9 10*3/uL (ref 1.4–7.7)
Neutrophils Relative %: 61.2 % (ref 43.0–77.0)
Platelets: 212 10*3/uL (ref 150.0–400.0)
RBC: 4.69 Mil/uL (ref 3.87–5.11)
RDW: 13.8 % (ref 11.5–15.5)
WBC: 6.3 10*3/uL (ref 4.0–10.5)

## 2014-03-15 LAB — BASIC METABOLIC PANEL
BUN: 16 mg/dL (ref 6–23)
CO2: 27 meq/L (ref 19–32)
Calcium: 10.8 mg/dL — ABNORMAL HIGH (ref 8.4–10.5)
Chloride: 104 mEq/L (ref 96–112)
Creatinine, Ser: 0.7 mg/dL (ref 0.4–1.2)
GFR: 92.01 mL/min (ref >60.00)
GLUCOSE: 79 mg/dL (ref 70–99)
Potassium: 4 mEq/L (ref 3.5–5.1)
Sodium: 138 mEq/L (ref 135–145)

## 2014-03-15 LAB — POCT URINALYSIS DIPSTICK
BILIRUBIN UA: NEGATIVE
Blood, UA: NEGATIVE
Glucose, UA: NEGATIVE
KETONES UA: NEGATIVE
Nitrite, UA: NEGATIVE
Protein, UA: NEGATIVE
SPEC GRAV UA: 1.015
Urobilinogen, UA: 0.2
pH, UA: 5.5

## 2014-03-15 LAB — T4, FREE: Free T4: 0.66 ng/dL (ref 0.60–1.60)

## 2014-03-15 LAB — LIPID PANEL
CHOLESTEROL: 213 mg/dL — AB (ref 0–200)
HDL: 78.7 mg/dL (ref >39.00)
LDL Cholesterol: 120 mg/dL — ABNORMAL HIGH (ref 0–99)
NONHDL: 134.3
TRIGLYCERIDES: 74 mg/dL (ref 0.0–149.0)
Total CHOL/HDL Ratio: 3
VLDL: 14.8 mg/dL (ref 0.0–40.0)

## 2014-03-15 LAB — HEPATIC FUNCTION PANEL
ALT: 30 U/L (ref 0–35)
AST: 30 U/L (ref 0–37)
Albumin: 4.3 g/dL (ref 3.5–5.2)
Alkaline Phosphatase: 60 U/L (ref 39–117)
BILIRUBIN DIRECT: 0 mg/dL (ref 0.0–0.3)
BILIRUBIN TOTAL: 0.5 mg/dL (ref 0.2–1.2)
Total Protein: 7.7 g/dL (ref 6.0–8.3)

## 2014-03-15 LAB — TSH: TSH: 2.21 u[IU]/mL (ref 0.35–4.50)

## 2014-03-15 MED ORDER — LEVOTHYROXINE SODIUM 25 MCG PO TABS: ORAL_TABLET | ORAL | Status: DC

## 2014-03-15 MED ORDER — NEOMYCIN-POLYMYXIN-HC 3.5-10000-1 OT SOLN: 3.0000 [drp] | Freq: Four times a day (QID) | OTIC | Status: DC

## 2014-03-15 NOTE — Progress Notes (Signed)
Pre visit review using our clinic review tool, if applicable. No additional management support is needed unless otherwise documented below in the visit note. 

## 2014-03-15 NOTE — Progress Notes (Signed)
   Subjective:    Patient ID: Christy Douglas, female    DOB: 1956/03/24, 58 y.o.   MRN: [id number]  HPI 58 yr old female for a cpx. She feels well. She has lost some weight and she is very pleased with this. She still swims every day.    Review of Systems  Constitutional: Negative.   HENT: Negative.   Eyes: Negative.   Respiratory: Negative.   Cardiovascular: Negative.   Gastrointestinal: Negative.   Genitourinary: Negative for dysuria, urgency, frequency, hematuria, flank pain, decreased urine volume, enuresis, difficulty urinating, pelvic pain and dyspareunia.  Musculoskeletal: Negative.   Skin: Negative.   Neurological: Negative.   Psychiatric/Behavioral: Negative.        Objective:   Physical Exam  Constitutional: She is oriented to person, place, and time. She appears well-developed and well-nourished. No distress.  HENT:  Head: Normocephalic and atraumatic.  Right Ear: External ear normal.  Left Ear: External ear normal.  Nose: Nose normal.  Mouth/Throat: Oropharynx is clear and moist. No oropharyngeal exudate.  Eyes: Conjunctivae and EOM are normal. Pupils are equal, round, and reactive to light. No scleral icterus.  Neck: Normal range of motion. Neck supple. No JVD present. No thyromegaly present.  Cardiovascular: Normal rate, regular rhythm, normal heart sounds and intact distal pulses.  Exam reveals no gallop and no friction rub.   No murmur heard. Pulmonary/Chest: Effort normal and breath sounds normal. No respiratory distress. She has no wheezes. She has no rales. She exhibits no tenderness.  Abdominal: Soft. Bowel sounds are normal. She exhibits no distension and no mass. There is no tenderness. There is no rebound and no guarding.  Musculoskeletal: Normal range of motion. She exhibits no edema and no tenderness.  Lymphadenopathy:    She has no cervical adenopathy.  Neurological: She is alert and oriented to person, place, and time. She has normal reflexes. No  cranial nerve deficit. She exhibits normal muscle tone. Coordination normal.  Skin: Skin is warm and dry. No rash noted. No erythema.  Psychiatric: She has a normal mood and affect. Her behavior is normal. Judgment and thought content normal.          Assessment & Plan:  Well exam. Get labs today and set up a DEXA soon

## 2014-04-06 ENCOUNTER — Encounter: Payer: Self-pay | Admitting: Family Medicine

## 2014-04-06 ENCOUNTER — Ambulatory Visit (INDEPENDENT_AMBULATORY_CARE_PROVIDER_SITE_OTHER): Payer: Medicare Other | Admitting: Family Medicine

## 2014-04-06 VITALS — BP 112/76 | HR 70 | Temp 98.6°F | Ht 63.5 in | Wt 150.0 lb

## 2014-04-06 DIAGNOSIS — B009 Herpesviral infection, unspecified: Secondary | ICD-10-CM | POA: Insufficient documentation

## 2014-04-06 MED ORDER — VALACYCLOVIR HCL 500 MG PO TABS: 500.0000 mg | ORAL_TABLET | Freq: Two times a day (BID) | ORAL | Status: DC

## 2014-04-06 NOTE — Progress Notes (Signed)
   Subjective:    Patient ID: Christy Douglas, female    DOB: 05-Nov-1955, 58 y.o.   MRN: [id number]  HPI Here for 10 days of a rash in the perineum which itches and burns. She had something like this once before many years ago. Using OTC cortisone ointment.    Review of Systems  Constitutional: Negative.   Skin: Positive for rash.       Objective:   Physical Exam  Constitutional: She appears well-developed and well-nourished.  Skin:  Cluster of red papules and vesicles over the left labia majora           Assessment & Plan:  This is herpes simplex. Treat with Valtrex.

## 2014-04-06 NOTE — Progress Notes (Signed)
Pre visit review using our clinic review tool, if applicable. No additional management support is needed unless otherwise documented below in the visit note. 

## 2014-08-08 DIAGNOSIS — M9901 Segmental and somatic dysfunction of cervical region: Secondary | ICD-10-CM | POA: Diagnosis not present

## 2014-08-08 DIAGNOSIS — M542 Cervicalgia: Secondary | ICD-10-CM | POA: Diagnosis not present

## 2014-08-08 DIAGNOSIS — S338XXA Sprain of other parts of lumbar spine and pelvis, initial encounter: Secondary | ICD-10-CM | POA: Diagnosis not present

## 2014-08-08 DIAGNOSIS — M9903 Segmental and somatic dysfunction of lumbar region: Secondary | ICD-10-CM | POA: Diagnosis not present

## 2014-08-29 ENCOUNTER — Other Ambulatory Visit: Payer: Self-pay

## 2014-08-29 MED ORDER — VALACYCLOVIR HCL 500 MG PO TABS: 500.0000 mg | ORAL_TABLET | Freq: Two times a day (BID) | ORAL | Status: DC

## 2014-08-29 MED ORDER — LEVOTHYROXINE SODIUM 25 MCG PO TABS: ORAL_TABLET | ORAL | Status: DC

## 2014-08-29 NOTE — Telephone Encounter (Signed)
Rx request for: Levothyroxine and Valacyclovir.    Pharm:  [facility]  Rx sent to pharmacy.

## 2014-09-03 DIAGNOSIS — M9901 Segmental and somatic dysfunction of cervical region: Secondary | ICD-10-CM | POA: Diagnosis not present

## 2014-09-03 DIAGNOSIS — M542 Cervicalgia: Secondary | ICD-10-CM | POA: Diagnosis not present

## 2014-09-03 DIAGNOSIS — S338XXA Sprain of other parts of lumbar spine and pelvis, initial encounter: Secondary | ICD-10-CM | POA: Diagnosis not present

## 2014-09-03 DIAGNOSIS — M9903 Segmental and somatic dysfunction of lumbar region: Secondary | ICD-10-CM | POA: Diagnosis not present

## 2014-09-24 DIAGNOSIS — M9901 Segmental and somatic dysfunction of cervical region: Secondary | ICD-10-CM | POA: Diagnosis not present

## 2014-09-24 DIAGNOSIS — M542 Cervicalgia: Secondary | ICD-10-CM | POA: Diagnosis not present

## 2014-09-24 DIAGNOSIS — S338XXA Sprain of other parts of lumbar spine and pelvis, initial encounter: Secondary | ICD-10-CM | POA: Diagnosis not present

## 2014-09-24 DIAGNOSIS — M9903 Segmental and somatic dysfunction of lumbar region: Secondary | ICD-10-CM | POA: Diagnosis not present

## 2014-10-15 DIAGNOSIS — S338XXA Sprain of other parts of lumbar spine and pelvis, initial encounter: Secondary | ICD-10-CM | POA: Diagnosis not present

## 2014-10-15 DIAGNOSIS — M9903 Segmental and somatic dysfunction of lumbar region: Secondary | ICD-10-CM | POA: Diagnosis not present

## 2014-10-15 DIAGNOSIS — M542 Cervicalgia: Secondary | ICD-10-CM | POA: Diagnosis not present

## 2014-10-15 DIAGNOSIS — M9901 Segmental and somatic dysfunction of cervical region: Secondary | ICD-10-CM | POA: Diagnosis not present

## 2014-11-19 ENCOUNTER — Telehealth: Payer: Self-pay | Admitting: Family Medicine

## 2014-11-19 DIAGNOSIS — M9901 Segmental and somatic dysfunction of cervical region: Secondary | ICD-10-CM | POA: Diagnosis not present

## 2014-11-19 DIAGNOSIS — M9903 Segmental and somatic dysfunction of lumbar region: Secondary | ICD-10-CM | POA: Diagnosis not present

## 2014-11-19 DIAGNOSIS — S338XXA Sprain of other parts of lumbar spine and pelvis, initial encounter: Secondary | ICD-10-CM | POA: Diagnosis not present

## 2014-11-19 DIAGNOSIS — M542 Cervicalgia: Secondary | ICD-10-CM | POA: Diagnosis not present

## 2014-11-19 NOTE — Telephone Encounter (Addendum)
Pt states the strap that holds brace on right leg broke this weekend . The company pt gets strap from has merged w/ another company and now pt needs a prescription. Pt sees dr fry more than her ortho md. and hopes dr fry can help.   Pt only needs the strap that holds the brace in place. Needs to say "pt requesting repair strap for leg brace/ Also need [facility]  [facility] [address], [place], [place] [address]  [phone] Fax  [phone]

## 2014-11-20 NOTE — Telephone Encounter (Signed)
Per Dr Sarajane Jews, pt should contact the ortho doctor. I left a voice message with this information.

## 2014-11-22 DIAGNOSIS — M25579 Pain in unspecified ankle and joints of unspecified foot: Secondary | ICD-10-CM | POA: Diagnosis not present

## 2014-12-10 DIAGNOSIS — M9903 Segmental and somatic dysfunction of lumbar region: Secondary | ICD-10-CM | POA: Diagnosis not present

## 2014-12-10 DIAGNOSIS — S338XXA Sprain of other parts of lumbar spine and pelvis, initial encounter: Secondary | ICD-10-CM | POA: Diagnosis not present

## 2014-12-10 DIAGNOSIS — M9901 Segmental and somatic dysfunction of cervical region: Secondary | ICD-10-CM | POA: Diagnosis not present

## 2014-12-10 DIAGNOSIS — M542 Cervicalgia: Secondary | ICD-10-CM | POA: Diagnosis not present

## 2014-12-11 ENCOUNTER — Other Ambulatory Visit: Payer: Self-pay | Admitting: Family Medicine

## 2014-12-25 ENCOUNTER — Telehealth: Payer: Self-pay

## 2014-12-25 DIAGNOSIS — Z1239 Encounter for other screening for malignant neoplasm of breast: Secondary | ICD-10-CM

## 2014-12-25 NOTE — Telephone Encounter (Signed)
Left message for pt to call back concerning need for mammogram 

## 2014-12-26 ENCOUNTER — Other Ambulatory Visit: Payer: Self-pay | Admitting: Family Medicine

## 2014-12-26 DIAGNOSIS — Z1239 Encounter for other screening for malignant neoplasm of breast: Secondary | ICD-10-CM

## 2014-12-26 NOTE — Telephone Encounter (Signed)
Per Dr. Sarajane Jews, order mammogram. I put in order and spoke with pt.

## 2014-12-26 NOTE — Addendum Note (Signed)
Addended by: Aggie Hacker A on: 12/26/2014 09:55 AM   Modules accepted: Orders

## 2014-12-26 NOTE — Telephone Encounter (Signed)
Patient called back and stated she can't remember if she has had a mammogram.  Patient would like to have one done, but she is unsure where to go or what to do.

## 2014-12-27 ENCOUNTER — Telehealth: Payer: Self-pay | Admitting: Family Medicine

## 2014-12-27 DIAGNOSIS — M542 Cervicalgia: Secondary | ICD-10-CM | POA: Diagnosis not present

## 2014-12-27 DIAGNOSIS — M9903 Segmental and somatic dysfunction of lumbar region: Secondary | ICD-10-CM | POA: Diagnosis not present

## 2014-12-27 DIAGNOSIS — S338XXA Sprain of other parts of lumbar spine and pelvis, initial encounter: Secondary | ICD-10-CM | POA: Diagnosis not present

## 2014-12-27 DIAGNOSIS — M9901 Segmental and somatic dysfunction of cervical region: Secondary | ICD-10-CM | POA: Diagnosis not present

## 2014-12-27 NOTE — Telephone Encounter (Signed)
Pt call stated someone call her from [place] about a Mammogram she said her last mammogram was done at [facility] on 07-09-14. Her call back # is [phone].

## 2014-12-28 NOTE — Telephone Encounter (Signed)
Information updated in the system

## 2015-01-17 DIAGNOSIS — M9901 Segmental and somatic dysfunction of cervical region: Secondary | ICD-10-CM | POA: Diagnosis not present

## 2015-01-17 DIAGNOSIS — M9903 Segmental and somatic dysfunction of lumbar region: Secondary | ICD-10-CM | POA: Diagnosis not present

## 2015-01-17 DIAGNOSIS — M542 Cervicalgia: Secondary | ICD-10-CM | POA: Diagnosis not present

## 2015-01-17 DIAGNOSIS — S338XXA Sprain of other parts of lumbar spine and pelvis, initial encounter: Secondary | ICD-10-CM | POA: Diagnosis not present

## 2015-02-07 DIAGNOSIS — M9901 Segmental and somatic dysfunction of cervical region: Secondary | ICD-10-CM | POA: Diagnosis not present

## 2015-02-07 DIAGNOSIS — M542 Cervicalgia: Secondary | ICD-10-CM | POA: Diagnosis not present

## 2015-02-07 DIAGNOSIS — S338XXA Sprain of other parts of lumbar spine and pelvis, initial encounter: Secondary | ICD-10-CM | POA: Diagnosis not present

## 2015-02-07 DIAGNOSIS — M9903 Segmental and somatic dysfunction of lumbar region: Secondary | ICD-10-CM | POA: Diagnosis not present

## 2015-02-28 DIAGNOSIS — S338XXA Sprain of other parts of lumbar spine and pelvis, initial encounter: Secondary | ICD-10-CM | POA: Diagnosis not present

## 2015-02-28 DIAGNOSIS — M9903 Segmental and somatic dysfunction of lumbar region: Secondary | ICD-10-CM | POA: Diagnosis not present

## 2015-02-28 DIAGNOSIS — M9901 Segmental and somatic dysfunction of cervical region: Secondary | ICD-10-CM | POA: Diagnosis not present

## 2015-02-28 DIAGNOSIS — M542 Cervicalgia: Secondary | ICD-10-CM | POA: Diagnosis not present

## 2015-03-06 ENCOUNTER — Encounter: Payer: Self-pay | Admitting: *Deleted

## 2015-03-12 ENCOUNTER — Other Ambulatory Visit: Payer: Self-pay | Admitting: Family Medicine

## 2015-03-18 DIAGNOSIS — M9901 Segmental and somatic dysfunction of cervical region: Secondary | ICD-10-CM | POA: Diagnosis not present

## 2015-03-18 DIAGNOSIS — S338XXA Sprain of other parts of lumbar spine and pelvis, initial encounter: Secondary | ICD-10-CM | POA: Diagnosis not present

## 2015-03-18 DIAGNOSIS — M9903 Segmental and somatic dysfunction of lumbar region: Secondary | ICD-10-CM | POA: Diagnosis not present

## 2015-03-18 DIAGNOSIS — M542 Cervicalgia: Secondary | ICD-10-CM | POA: Diagnosis not present

## 2015-04-03 DIAGNOSIS — M9901 Segmental and somatic dysfunction of cervical region: Secondary | ICD-10-CM | POA: Diagnosis not present

## 2015-04-03 DIAGNOSIS — M542 Cervicalgia: Secondary | ICD-10-CM | POA: Diagnosis not present

## 2015-04-03 DIAGNOSIS — M9903 Segmental and somatic dysfunction of lumbar region: Secondary | ICD-10-CM | POA: Diagnosis not present

## 2015-04-03 DIAGNOSIS — S338XXA Sprain of other parts of lumbar spine and pelvis, initial encounter: Secondary | ICD-10-CM | POA: Diagnosis not present

## 2015-04-25 DIAGNOSIS — M9901 Segmental and somatic dysfunction of cervical region: Secondary | ICD-10-CM | POA: Diagnosis not present

## 2015-04-25 DIAGNOSIS — S338XXA Sprain of other parts of lumbar spine and pelvis, initial encounter: Secondary | ICD-10-CM | POA: Diagnosis not present

## 2015-04-25 DIAGNOSIS — M542 Cervicalgia: Secondary | ICD-10-CM | POA: Diagnosis not present

## 2015-04-25 DIAGNOSIS — M9903 Segmental and somatic dysfunction of lumbar region: Secondary | ICD-10-CM | POA: Diagnosis not present

## 2015-05-16 ENCOUNTER — Encounter: Payer: Self-pay | Admitting: Family Medicine

## 2015-05-16 ENCOUNTER — Ambulatory Visit (INDEPENDENT_AMBULATORY_CARE_PROVIDER_SITE_OTHER): Payer: Medicare Other | Admitting: Family Medicine

## 2015-05-16 VITALS — BP 132/79 | HR 69 | Temp 98.2°F | Ht 63.5 in | Wt 156.0 lb

## 2015-05-16 DIAGNOSIS — L237 Allergic contact dermatitis due to plants, except food: Secondary | ICD-10-CM | POA: Diagnosis not present

## 2015-05-16 DIAGNOSIS — H60399 Other infective otitis externa, unspecified ear: Secondary | ICD-10-CM

## 2015-05-16 DIAGNOSIS — L259 Unspecified contact dermatitis, unspecified cause: Secondary | ICD-10-CM

## 2015-05-16 MED ORDER — NEOMYCIN-POLYMYXIN-HC 3.5-10000-1 OT SOLN: OTIC | Status: DC

## 2015-05-16 MED ORDER — PREDNISONE 10 MG PO TABS: ORAL_TABLET | ORAL | Status: DC

## 2015-05-16 NOTE — Progress Notes (Signed)
Pre visit review using our clinic review tool, if applicable. No additional management support is needed unless otherwise documented below in the visit note. 

## 2015-05-16 NOTE — Progress Notes (Signed)
   Subjective:    Patient ID: Christy Douglas, female    DOB: Feb 22, 1956, 59 y.o.   MRN: [id number]  HPI Here for one week of itchy poison ivy rash over the arms, legs, and trunk. She is applying Calmine lotion and taking Allegra twice a day. She also needs er ear drops refilled. She swims daily for exercise and often gets ear canal inflammation from this.    Review of Systems  Constitutional: Negative.   HENT: Positive for ear discharge and ear pain.   Eyes: Negative.   Respiratory: Negative.   Cardiovascular: Negative.   Skin: Positive for rash.       Objective:   Physical Exam  Constitutional: She appears well-developed and well-nourished.  HENT:  Left Ear: External ear normal.  Nose: Nose normal.  Mouth/Throat: Oropharynx is clear and moist.  Right ear canal is red   Eyes: Conjunctivae are normal.  Lymphadenopathy:    She has no cervical adenopathy.  Skin:  Widespread red vesicles and papules           Assessment & Plan:  Treat the poison ivy with a prednisone taper. Treat the otitis with Cortisporin drops

## 2015-05-23 DIAGNOSIS — M9901 Segmental and somatic dysfunction of cervical region: Secondary | ICD-10-CM | POA: Diagnosis not present

## 2015-05-23 DIAGNOSIS — M542 Cervicalgia: Secondary | ICD-10-CM | POA: Diagnosis not present

## 2015-05-23 DIAGNOSIS — M9903 Segmental and somatic dysfunction of lumbar region: Secondary | ICD-10-CM | POA: Diagnosis not present

## 2015-05-23 DIAGNOSIS — S338XXA Sprain of other parts of lumbar spine and pelvis, initial encounter: Secondary | ICD-10-CM | POA: Diagnosis not present

## 2015-06-20 DIAGNOSIS — S338XXA Sprain of other parts of lumbar spine and pelvis, initial encounter: Secondary | ICD-10-CM | POA: Diagnosis not present

## 2015-06-20 DIAGNOSIS — M9901 Segmental and somatic dysfunction of cervical region: Secondary | ICD-10-CM | POA: Diagnosis not present

## 2015-06-20 DIAGNOSIS — M542 Cervicalgia: Secondary | ICD-10-CM | POA: Diagnosis not present

## 2015-06-20 DIAGNOSIS — M9903 Segmental and somatic dysfunction of lumbar region: Secondary | ICD-10-CM | POA: Diagnosis not present

## 2015-06-24 ENCOUNTER — Ambulatory Visit (INDEPENDENT_AMBULATORY_CARE_PROVIDER_SITE_OTHER): Payer: Medicare Other | Admitting: Family Medicine

## 2015-06-24 ENCOUNTER — Encounter: Payer: Self-pay | Admitting: Family Medicine

## 2015-06-24 VITALS — BP 120/70 | HR 69 | Temp 98.2°F | Ht 63.5 in | Wt 155.0 lb

## 2015-06-24 DIAGNOSIS — H6122 Impacted cerumen, left ear: Secondary | ICD-10-CM | POA: Diagnosis not present

## 2015-06-24 NOTE — Progress Notes (Signed)
   Subjective:    Patient ID: Christy Douglas, female    DOB: 1955/11/28, 59 y.o.   MRN: [id number]  HPI Here for decreased hearing in the left ear for several weeks. No pain.    Review of Systems  Constitutional: Negative.   HENT: Positive for hearing loss. Negative for ear discharge and ear pain.   Eyes: Negative.   Respiratory: Negative.   Cardiovascular: Negative.        Objective:   Physical Exam  Constitutional: She appears well-developed and well-nourished.  HENT:  Right Ear: External ear normal.  Nose: Nose normal.  Mouth/Throat: Oropharynx is clear and moist.  Left ear canal is full of cerumen   Eyes: Conjunctivae are normal.  Neck: No thyromegaly present.  Lymphadenopathy:    She has no cervical adenopathy.          Assessment & Plan:  Cerumen impaction, irrigated clear with water

## 2015-06-24 NOTE — Progress Notes (Signed)
Pre visit review using our clinic review tool, if applicable. No additional management support is needed unless otherwise documented below in the visit note. 

## 2015-06-26 DIAGNOSIS — M542 Cervicalgia: Secondary | ICD-10-CM | POA: Diagnosis not present

## 2015-06-26 DIAGNOSIS — S338XXA Sprain of other parts of lumbar spine and pelvis, initial encounter: Secondary | ICD-10-CM | POA: Diagnosis not present

## 2015-06-26 DIAGNOSIS — M9903 Segmental and somatic dysfunction of lumbar region: Secondary | ICD-10-CM | POA: Diagnosis not present

## 2015-06-26 DIAGNOSIS — M9901 Segmental and somatic dysfunction of cervical region: Secondary | ICD-10-CM | POA: Diagnosis not present

## 2015-07-16 DIAGNOSIS — Z01419 Encounter for gynecological examination (general) (routine) without abnormal findings: Secondary | ICD-10-CM | POA: Diagnosis not present

## 2015-07-16 DIAGNOSIS — Z6827 Body mass index (BMI) 27.0-27.9, adult: Secondary | ICD-10-CM | POA: Diagnosis not present

## 2015-07-18 DIAGNOSIS — Z1231 Encounter for screening mammogram for malignant neoplasm of breast: Secondary | ICD-10-CM | POA: Diagnosis not present

## 2015-07-22 DIAGNOSIS — M9903 Segmental and somatic dysfunction of lumbar region: Secondary | ICD-10-CM | POA: Diagnosis not present

## 2015-07-22 DIAGNOSIS — M542 Cervicalgia: Secondary | ICD-10-CM | POA: Diagnosis not present

## 2015-07-22 DIAGNOSIS — S338XXA Sprain of other parts of lumbar spine and pelvis, initial encounter: Secondary | ICD-10-CM | POA: Diagnosis not present

## 2015-07-22 DIAGNOSIS — M9901 Segmental and somatic dysfunction of cervical region: Secondary | ICD-10-CM | POA: Diagnosis not present

## 2015-07-23 ENCOUNTER — Other Ambulatory Visit: Payer: Self-pay | Admitting: Family Medicine

## 2015-08-08 DIAGNOSIS — S338XXA Sprain of other parts of lumbar spine and pelvis, initial encounter: Secondary | ICD-10-CM | POA: Diagnosis not present

## 2015-08-08 DIAGNOSIS — M542 Cervicalgia: Secondary | ICD-10-CM | POA: Diagnosis not present

## 2015-08-08 DIAGNOSIS — M9901 Segmental and somatic dysfunction of cervical region: Secondary | ICD-10-CM | POA: Diagnosis not present

## 2015-08-08 DIAGNOSIS — M9903 Segmental and somatic dysfunction of lumbar region: Secondary | ICD-10-CM | POA: Diagnosis not present

## 2015-08-26 DIAGNOSIS — M9901 Segmental and somatic dysfunction of cervical region: Secondary | ICD-10-CM | POA: Diagnosis not present

## 2015-08-26 DIAGNOSIS — M9903 Segmental and somatic dysfunction of lumbar region: Secondary | ICD-10-CM | POA: Diagnosis not present

## 2015-08-26 DIAGNOSIS — S338XXA Sprain of other parts of lumbar spine and pelvis, initial encounter: Secondary | ICD-10-CM | POA: Diagnosis not present

## 2015-08-26 DIAGNOSIS — M542 Cervicalgia: Secondary | ICD-10-CM | POA: Diagnosis not present

## 2015-09-09 ENCOUNTER — Other Ambulatory Visit: Payer: Self-pay | Admitting: Family Medicine

## 2015-09-09 DIAGNOSIS — Z1231 Encounter for screening mammogram for malignant neoplasm of breast: Secondary | ICD-10-CM

## 2015-09-12 DIAGNOSIS — M9901 Segmental and somatic dysfunction of cervical region: Secondary | ICD-10-CM | POA: Diagnosis not present

## 2015-09-12 DIAGNOSIS — M9903 Segmental and somatic dysfunction of lumbar region: Secondary | ICD-10-CM | POA: Diagnosis not present

## 2015-09-12 DIAGNOSIS — M542 Cervicalgia: Secondary | ICD-10-CM | POA: Diagnosis not present

## 2015-09-12 DIAGNOSIS — S338XXA Sprain of other parts of lumbar spine and pelvis, initial encounter: Secondary | ICD-10-CM | POA: Diagnosis not present

## 2015-10-02 DIAGNOSIS — M9901 Segmental and somatic dysfunction of cervical region: Secondary | ICD-10-CM | POA: Diagnosis not present

## 2015-10-02 DIAGNOSIS — M9903 Segmental and somatic dysfunction of lumbar region: Secondary | ICD-10-CM | POA: Diagnosis not present

## 2015-10-02 DIAGNOSIS — M542 Cervicalgia: Secondary | ICD-10-CM | POA: Diagnosis not present

## 2015-10-02 DIAGNOSIS — S338XXA Sprain of other parts of lumbar spine and pelvis, initial encounter: Secondary | ICD-10-CM | POA: Diagnosis not present

## 2015-10-24 DIAGNOSIS — M542 Cervicalgia: Secondary | ICD-10-CM | POA: Diagnosis not present

## 2015-10-24 DIAGNOSIS — M9903 Segmental and somatic dysfunction of lumbar region: Secondary | ICD-10-CM | POA: Diagnosis not present

## 2015-10-24 DIAGNOSIS — S338XXA Sprain of other parts of lumbar spine and pelvis, initial encounter: Secondary | ICD-10-CM | POA: Diagnosis not present

## 2015-10-24 DIAGNOSIS — M9901 Segmental and somatic dysfunction of cervical region: Secondary | ICD-10-CM | POA: Diagnosis not present

## 2015-10-30 ENCOUNTER — Telehealth: Payer: Self-pay

## 2015-10-30 ENCOUNTER — Encounter: Payer: Self-pay | Admitting: Family Medicine

## 2015-10-30 ENCOUNTER — Ambulatory Visit (INDEPENDENT_AMBULATORY_CARE_PROVIDER_SITE_OTHER): Payer: Medicare Other | Admitting: Family Medicine

## 2015-10-30 VITALS — BP 120/82 | HR 71 | Temp 97.8°F | Wt 160.0 lb

## 2015-10-30 DIAGNOSIS — H6122 Impacted cerumen, left ear: Secondary | ICD-10-CM

## 2015-10-30 DIAGNOSIS — H60392 Other infective otitis externa, left ear: Secondary | ICD-10-CM

## 2015-10-30 NOTE — Progress Notes (Signed)
PCP: Laurey Morale, MD  Subjective:  Christy Douglas is a 60 y.o. year old very pleasant female patient who presents with left ear fullness and aching.   ROS-denies fever, SOB, NVD, tooth pain. No recent cough or congestion.   Pertinent Past Medical History-  Patient Active Problem List   Diagnosis Date Noted  . Herpes simplex 04/06/2014  . Foot drop, right 03/15/2014  . Hypothyroidism 02/01/2013  . ECZEMA 10/05/2008  . ALLERGIC RHINITIS 02/20/2008  . OSTEOPOROSIS 02/20/2008   Medications- reviewed  Current Outpatient Prescriptions  Medication Sig Dispense Refill  . Ascorbic Acid (VITAMIN C) 1000 MG tablet Take 1,000 mg by mouth daily.      . B Complex Vitamins (B COMPLEX 50 PO) Take by mouth daily.      . Cholecalciferol (VITAMIN D3) 1000 UNITS CAPS Take by mouth 2 (two) times daily.     . Evening Primrose Oil 500 MG CAPS Take by mouth daily.      . fexofenadine (ALLEGRA) 180 MG tablet Take 180 mg by mouth daily.    Marland Kitchen levothyroxine (SYNTHROID, LEVOTHROID) 25 MCG tablet TAKE ONE TABLET BY MOUTH  ONCE DAILY BEFORE BREAKFAST 90 tablet 0  . Multiple Vitamin (MULTIVITAMIN) tablet Take 1 tablet by mouth daily.    Marland Kitchen neomycin-polymyxin-hydrocortisone (CORTISPORIN) otic solution PLACE 3 DROPS INTO THE RIGHT EAR 4 TIMES DAILY 10 mL 5  . triamcinolone (KENALOG) 0.025 % cream Apply 2 application topically 2 (two) times daily. Decreases inflammation    . vitamin A 8000 UNIT capsule Take 8,000 Units by mouth daily. Take 6 a day    . vitamin E 400 UNIT capsule Take 400 Units by mouth daily.      Marland Kitchen EPINEPHrine (EPIPEN 2-PAK) 0.3 mg/0.3 mL DEVI Inject 0.3 mLs (0.3 mg total) into the muscle once. (Patient not taking: Reported on 05/16/2015) 2 Device 5  . predniSONE (DELTASONE) 10 MG tablet Take 4 tabs a day for 4 days, then 3 a day for 4 days, then 2 a day for 4 days, then 1 a day for 4 days, then stop (Patient not taking: Reported on 06/24/2015) 60 tablet 0  . tacrolimus (PROTOPIC) 0.1 % ointment Apply  topically 2 (two) times daily. (Patient not taking: Reported on 05/16/2015) 30 g 5  . valACYclovir (VALTREX) 500 MG tablet Take 1 tablet by mouth two  times daily (Patient not taking: Reported on 05/16/2015) 84 tablet 1   No current facility-administered medications for this visit.    Objective: BP 120/82 mmHg  Pulse 71  Temp(Src) 97.8 F (36.6 C)  Wt 160 lb (72.576 kg) Gen: NAD, resting comfortably HEENT: Turbinates normal, TM normal on right though does have some slight scaling on outer ear and very mild erythema in canal. On left side she has cerumen impaction. After clearance- erythema and swelling in canal but TM normal, oropharynx normal, no sinus tenderness CV: RRR no murmurs rubs or gallops Lungs: CTAB no crackles, wheeze, rhonchi Skin: warm, dry, no rash  Assessment/Plan:  Left ear pain and fullness S: swims twice a week yearround. Has neomycin-polymyxin-hydrocortisone otic solution on hand through Dr. Sarajane Jews and will takes if ears start itching or aching the least bit-states takes for 1 day and knocks it out if starts bothering her. Despite having this on hand she has noted progressive feeling of Left ear being very stopped up- feels very congested. Mild aching and some itch. Denies q tip use or putting anything in ears A/P: Irrigation done by Clyde Lundborg, CMA  today and noted tissue like substance- patient unclear how this happened. After clearance of cerumen for cerumen impaction- ear did still appear erythematous with mild swelling. She is going to use her cortisporin otic solution 4x a day for 5 days for otitis externa. Reinforced putting no solid objects in the ears including tissues and q tips  Finally, we reviewed reasons to return to care including if symptoms worsen or persist or new concerns arise.  Already has rx and refill- will fill today

## 2015-10-30 NOTE — Telephone Encounter (Signed)
Pt came in today requesting refill on Levothyroxine 57mcg sent to [facility]

## 2015-10-30 NOTE — Patient Instructions (Signed)
Use cortisporin for 5 days to ensure full resolution of irritation in ear canal (otitis externa)  You may start swimming again in 10 days  Otitis Externa Otitis externa is a bacterial or fungal infection of the outer ear canal. This is the area from the eardrum to the outside of the ear. Otitis externa is sometimes called "swimmer's ear." CAUSES  Possible causes of infection include:  Swimming in dirty water.  Moisture remaining in the ear after swimming or bathing.  Mild injury (trauma) to the ear.  Objects stuck in the ear (foreign body).  Cuts or scrapes (abrasions) on the outside of the ear. SIGNS AND SYMPTOMS  The first symptom of infection is often itching in the ear canal. Later signs and symptoms may include swelling and redness of the ear canal, ear pain, and yellowish-white fluid (pus) coming from the ear. The ear pain may be worse when pulling on the earlobe. DIAGNOSIS  Your health care provider will perform a physical exam. A sample of fluid may be taken from the ear and examined for bacteria or fungi. TREATMENT  Antibiotic ear drops are often given for 10 to 14 days. Treatment may also include pain medicine or corticosteroids to reduce itching and swelling. HOME CARE INSTRUCTIONS   Apply antibiotic ear drops to the ear canal as prescribed by your health care provider.  Take medicines only as directed by your health care provider.  If you have diabetes, follow any additional treatment instructions from your health care provider.  Keep all follow-up visits as directed by your health care provider. PREVENTION   Keep your ear dry. Use the corner of a towel to absorb water out of the ear canal after swimming or bathing.  Avoid scratching or putting objects inside your ear. This can damage the ear canal or remove the protective wax that lines the canal. This makes it easier for bacteria and fungi to grow.  Avoid swimming in lakes, polluted water, or poorly chlorinated  pools.  You may use ear drops made of rubbing alcohol and vinegar after swimming. Combine equal parts of white vinegar and alcohol in a bottle. Put 3 or 4 drops into each ear after swimming. SEEK MEDICAL CARE IF:   You have a fever.  Your ear is still red, swollen, painful, or draining pus after 3 days.  Your redness, swelling, or pain gets worse.  You have a severe headache.  You have redness, swelling, pain, or tenderness in the area behind your ear. MAKE SURE YOU:   Understand these instructions.  Will watch your condition.  Will get help right away if you are not doing well or get worse.   This information is not intended to replace advice given to you by your health care provider. Make sure you discuss any questions you have with your health care provider.   Document Released: 07/20/2005 Document Revised: 08/10/2014 Document Reviewed: 08/06/2011 Elsevier Interactive Patient Education [employer].

## 2015-11-01 ENCOUNTER — Other Ambulatory Visit: Payer: Self-pay

## 2015-11-01 MED ORDER — LEVOTHYROXINE SODIUM 25 MCG PO TABS: 25.0000 ug | ORAL_TABLET | Freq: Every day | ORAL | Status: DC

## 2015-11-01 NOTE — Telephone Encounter (Signed)
Refill for 90 days. She will need an OV soon to check a blood level

## 2015-11-01 NOTE — Addendum Note (Signed)
Addended by: Ailene Rud E on: 11/01/2015 05:12 PM   Modules accepted: Orders

## 2015-11-01 NOTE — Telephone Encounter (Signed)
Ok to refill 

## 2015-11-01 NOTE — Telephone Encounter (Signed)
Rx was sent to the pharmacy  

## 2015-11-18 DIAGNOSIS — M542 Cervicalgia: Secondary | ICD-10-CM | POA: Diagnosis not present

## 2015-11-18 DIAGNOSIS — S338XXA Sprain of other parts of lumbar spine and pelvis, initial encounter: Secondary | ICD-10-CM | POA: Diagnosis not present

## 2015-11-18 DIAGNOSIS — M9901 Segmental and somatic dysfunction of cervical region: Secondary | ICD-10-CM | POA: Diagnosis not present

## 2015-11-18 DIAGNOSIS — M9903 Segmental and somatic dysfunction of lumbar region: Secondary | ICD-10-CM | POA: Diagnosis not present

## 2016-01-13 ENCOUNTER — Other Ambulatory Visit: Payer: Self-pay | Admitting: Family Medicine

## 2016-01-13 DIAGNOSIS — M9901 Segmental and somatic dysfunction of cervical region: Secondary | ICD-10-CM | POA: Diagnosis not present

## 2016-01-13 DIAGNOSIS — M9903 Segmental and somatic dysfunction of lumbar region: Secondary | ICD-10-CM | POA: Diagnosis not present

## 2016-01-13 DIAGNOSIS — S338XXA Sprain of other parts of lumbar spine and pelvis, initial encounter: Secondary | ICD-10-CM | POA: Diagnosis not present

## 2016-01-13 DIAGNOSIS — M542 Cervicalgia: Secondary | ICD-10-CM | POA: Diagnosis not present

## 2016-01-13 NOTE — Telephone Encounter (Signed)
Pt is requesting a refill. Last time her TSH was checked was on 03/15/14. Can we fill this? Or does she need a lab appt?

## 2016-01-27 DIAGNOSIS — M9903 Segmental and somatic dysfunction of lumbar region: Secondary | ICD-10-CM | POA: Diagnosis not present

## 2016-01-27 DIAGNOSIS — M9901 Segmental and somatic dysfunction of cervical region: Secondary | ICD-10-CM | POA: Diagnosis not present

## 2016-01-27 DIAGNOSIS — S338XXA Sprain of other parts of lumbar spine and pelvis, initial encounter: Secondary | ICD-10-CM | POA: Diagnosis not present

## 2016-01-27 DIAGNOSIS — M542 Cervicalgia: Secondary | ICD-10-CM | POA: Diagnosis not present

## 2016-02-05 DIAGNOSIS — M9903 Segmental and somatic dysfunction of lumbar region: Secondary | ICD-10-CM | POA: Diagnosis not present

## 2016-02-05 DIAGNOSIS — M9901 Segmental and somatic dysfunction of cervical region: Secondary | ICD-10-CM | POA: Diagnosis not present

## 2016-02-05 DIAGNOSIS — M542 Cervicalgia: Secondary | ICD-10-CM | POA: Diagnosis not present

## 2016-02-05 DIAGNOSIS — S338XXA Sprain of other parts of lumbar spine and pelvis, initial encounter: Secondary | ICD-10-CM | POA: Diagnosis not present

## 2016-02-19 DIAGNOSIS — S338XXA Sprain of other parts of lumbar spine and pelvis, initial encounter: Secondary | ICD-10-CM | POA: Diagnosis not present

## 2016-02-19 DIAGNOSIS — M542 Cervicalgia: Secondary | ICD-10-CM | POA: Diagnosis not present

## 2016-02-19 DIAGNOSIS — M9901 Segmental and somatic dysfunction of cervical region: Secondary | ICD-10-CM | POA: Diagnosis not present

## 2016-02-19 DIAGNOSIS — M9903 Segmental and somatic dysfunction of lumbar region: Secondary | ICD-10-CM | POA: Diagnosis not present

## 2016-03-02 DIAGNOSIS — M9903 Segmental and somatic dysfunction of lumbar region: Secondary | ICD-10-CM | POA: Diagnosis not present

## 2016-03-02 DIAGNOSIS — S338XXA Sprain of other parts of lumbar spine and pelvis, initial encounter: Secondary | ICD-10-CM | POA: Diagnosis not present

## 2016-03-02 DIAGNOSIS — M9901 Segmental and somatic dysfunction of cervical region: Secondary | ICD-10-CM | POA: Diagnosis not present

## 2016-03-02 DIAGNOSIS — M542 Cervicalgia: Secondary | ICD-10-CM | POA: Diagnosis not present

## 2016-03-16 DIAGNOSIS — M9903 Segmental and somatic dysfunction of lumbar region: Secondary | ICD-10-CM | POA: Diagnosis not present

## 2016-03-16 DIAGNOSIS — M542 Cervicalgia: Secondary | ICD-10-CM | POA: Diagnosis not present

## 2016-03-16 DIAGNOSIS — S338XXA Sprain of other parts of lumbar spine and pelvis, initial encounter: Secondary | ICD-10-CM | POA: Diagnosis not present

## 2016-03-16 DIAGNOSIS — M9901 Segmental and somatic dysfunction of cervical region: Secondary | ICD-10-CM | POA: Diagnosis not present

## 2016-04-09 DIAGNOSIS — S338XXA Sprain of other parts of lumbar spine and pelvis, initial encounter: Secondary | ICD-10-CM | POA: Diagnosis not present

## 2016-04-09 DIAGNOSIS — M9901 Segmental and somatic dysfunction of cervical region: Secondary | ICD-10-CM | POA: Diagnosis not present

## 2016-04-09 DIAGNOSIS — M9903 Segmental and somatic dysfunction of lumbar region: Secondary | ICD-10-CM | POA: Diagnosis not present

## 2016-04-09 DIAGNOSIS — M542 Cervicalgia: Secondary | ICD-10-CM | POA: Diagnosis not present

## 2016-04-28 DIAGNOSIS — M542 Cervicalgia: Secondary | ICD-10-CM | POA: Diagnosis not present

## 2016-04-28 DIAGNOSIS — M9903 Segmental and somatic dysfunction of lumbar region: Secondary | ICD-10-CM | POA: Diagnosis not present

## 2016-04-28 DIAGNOSIS — S338XXA Sprain of other parts of lumbar spine and pelvis, initial encounter: Secondary | ICD-10-CM | POA: Diagnosis not present

## 2016-04-28 DIAGNOSIS — M9901 Segmental and somatic dysfunction of cervical region: Secondary | ICD-10-CM | POA: Diagnosis not present

## 2016-05-13 DIAGNOSIS — M9903 Segmental and somatic dysfunction of lumbar region: Secondary | ICD-10-CM | POA: Diagnosis not present

## 2016-05-13 DIAGNOSIS — M9901 Segmental and somatic dysfunction of cervical region: Secondary | ICD-10-CM | POA: Diagnosis not present

## 2016-05-13 DIAGNOSIS — M542 Cervicalgia: Secondary | ICD-10-CM | POA: Diagnosis not present

## 2016-05-13 DIAGNOSIS — S338XXA Sprain of other parts of lumbar spine and pelvis, initial encounter: Secondary | ICD-10-CM | POA: Diagnosis not present

## 2016-05-27 DIAGNOSIS — M9903 Segmental and somatic dysfunction of lumbar region: Secondary | ICD-10-CM | POA: Diagnosis not present

## 2016-05-27 DIAGNOSIS — S338XXA Sprain of other parts of lumbar spine and pelvis, initial encounter: Secondary | ICD-10-CM | POA: Diagnosis not present

## 2016-05-27 DIAGNOSIS — M542 Cervicalgia: Secondary | ICD-10-CM | POA: Diagnosis not present

## 2016-05-27 DIAGNOSIS — M9901 Segmental and somatic dysfunction of cervical region: Secondary | ICD-10-CM | POA: Diagnosis not present

## 2016-06-18 DIAGNOSIS — M9903 Segmental and somatic dysfunction of lumbar region: Secondary | ICD-10-CM | POA: Diagnosis not present

## 2016-06-18 DIAGNOSIS — S338XXA Sprain of other parts of lumbar spine and pelvis, initial encounter: Secondary | ICD-10-CM | POA: Diagnosis not present

## 2016-06-18 DIAGNOSIS — M542 Cervicalgia: Secondary | ICD-10-CM | POA: Diagnosis not present

## 2016-06-18 DIAGNOSIS — M9901 Segmental and somatic dysfunction of cervical region: Secondary | ICD-10-CM | POA: Diagnosis not present

## 2016-07-22 DIAGNOSIS — Z1231 Encounter for screening mammogram for malignant neoplasm of breast: Secondary | ICD-10-CM | POA: Diagnosis not present

## 2016-07-22 DIAGNOSIS — Z01419 Encounter for gynecological examination (general) (routine) without abnormal findings: Secondary | ICD-10-CM | POA: Diagnosis not present

## 2016-07-23 DIAGNOSIS — M542 Cervicalgia: Secondary | ICD-10-CM | POA: Diagnosis not present

## 2016-07-23 DIAGNOSIS — S338XXA Sprain of other parts of lumbar spine and pelvis, initial encounter: Secondary | ICD-10-CM | POA: Diagnosis not present

## 2016-07-23 DIAGNOSIS — M9903 Segmental and somatic dysfunction of lumbar region: Secondary | ICD-10-CM | POA: Diagnosis not present

## 2016-07-23 DIAGNOSIS — M9901 Segmental and somatic dysfunction of cervical region: Secondary | ICD-10-CM | POA: Diagnosis not present

## 2016-08-18 DIAGNOSIS — M9903 Segmental and somatic dysfunction of lumbar region: Secondary | ICD-10-CM | POA: Diagnosis not present

## 2016-08-18 DIAGNOSIS — S338XXA Sprain of other parts of lumbar spine and pelvis, initial encounter: Secondary | ICD-10-CM | POA: Diagnosis not present

## 2016-08-18 DIAGNOSIS — M9901 Segmental and somatic dysfunction of cervical region: Secondary | ICD-10-CM | POA: Diagnosis not present

## 2016-08-18 DIAGNOSIS — M542 Cervicalgia: Secondary | ICD-10-CM | POA: Diagnosis not present

## 2016-09-03 DIAGNOSIS — M9903 Segmental and somatic dysfunction of lumbar region: Secondary | ICD-10-CM | POA: Diagnosis not present

## 2016-09-03 DIAGNOSIS — M9901 Segmental and somatic dysfunction of cervical region: Secondary | ICD-10-CM | POA: Diagnosis not present

## 2016-09-03 DIAGNOSIS — M542 Cervicalgia: Secondary | ICD-10-CM | POA: Diagnosis not present

## 2016-09-03 DIAGNOSIS — S338XXA Sprain of other parts of lumbar spine and pelvis, initial encounter: Secondary | ICD-10-CM | POA: Diagnosis not present

## 2016-09-23 DIAGNOSIS — M542 Cervicalgia: Secondary | ICD-10-CM | POA: Diagnosis not present

## 2016-09-23 DIAGNOSIS — S338XXA Sprain of other parts of lumbar spine and pelvis, initial encounter: Secondary | ICD-10-CM | POA: Diagnosis not present

## 2016-09-23 DIAGNOSIS — M9901 Segmental and somatic dysfunction of cervical region: Secondary | ICD-10-CM | POA: Diagnosis not present

## 2016-09-23 DIAGNOSIS — M9903 Segmental and somatic dysfunction of lumbar region: Secondary | ICD-10-CM | POA: Diagnosis not present

## 2016-09-29 ENCOUNTER — Other Ambulatory Visit: Payer: Self-pay | Admitting: Family Medicine

## 2016-09-29 NOTE — Telephone Encounter (Signed)
Can we refill this? 

## 2016-10-14 DIAGNOSIS — M816 Localized osteoporosis [Lequesne]: Secondary | ICD-10-CM | POA: Diagnosis not present

## 2016-10-14 DIAGNOSIS — M9901 Segmental and somatic dysfunction of cervical region: Secondary | ICD-10-CM | POA: Diagnosis not present

## 2016-10-14 DIAGNOSIS — N958 Other specified menopausal and perimenopausal disorders: Secondary | ICD-10-CM | POA: Diagnosis not present

## 2016-10-14 DIAGNOSIS — S338XXA Sprain of other parts of lumbar spine and pelvis, initial encounter: Secondary | ICD-10-CM | POA: Diagnosis not present

## 2016-10-14 DIAGNOSIS — M542 Cervicalgia: Secondary | ICD-10-CM | POA: Diagnosis not present

## 2016-10-14 DIAGNOSIS — M9903 Segmental and somatic dysfunction of lumbar region: Secondary | ICD-10-CM | POA: Diagnosis not present

## 2016-10-14 DIAGNOSIS — Z13820 Encounter for screening for osteoporosis: Secondary | ICD-10-CM | POA: Diagnosis not present

## 2016-10-22 DIAGNOSIS — S338XXA Sprain of other parts of lumbar spine and pelvis, initial encounter: Secondary | ICD-10-CM | POA: Diagnosis not present

## 2016-10-22 DIAGNOSIS — M542 Cervicalgia: Secondary | ICD-10-CM | POA: Diagnosis not present

## 2016-10-22 DIAGNOSIS — M9901 Segmental and somatic dysfunction of cervical region: Secondary | ICD-10-CM | POA: Diagnosis not present

## 2016-10-22 DIAGNOSIS — M9903 Segmental and somatic dysfunction of lumbar region: Secondary | ICD-10-CM | POA: Diagnosis not present

## 2016-11-03 DIAGNOSIS — M9903 Segmental and somatic dysfunction of lumbar region: Secondary | ICD-10-CM | POA: Diagnosis not present

## 2016-11-03 DIAGNOSIS — M9901 Segmental and somatic dysfunction of cervical region: Secondary | ICD-10-CM | POA: Diagnosis not present

## 2016-11-03 DIAGNOSIS — M542 Cervicalgia: Secondary | ICD-10-CM | POA: Diagnosis not present

## 2016-11-03 DIAGNOSIS — S338XXA Sprain of other parts of lumbar spine and pelvis, initial encounter: Secondary | ICD-10-CM | POA: Diagnosis not present

## 2016-11-24 DIAGNOSIS — M542 Cervicalgia: Secondary | ICD-10-CM | POA: Diagnosis not present

## 2016-11-24 DIAGNOSIS — S338XXA Sprain of other parts of lumbar spine and pelvis, initial encounter: Secondary | ICD-10-CM | POA: Diagnosis not present

## 2016-11-24 DIAGNOSIS — M9901 Segmental and somatic dysfunction of cervical region: Secondary | ICD-10-CM | POA: Diagnosis not present

## 2016-11-24 DIAGNOSIS — M9903 Segmental and somatic dysfunction of lumbar region: Secondary | ICD-10-CM | POA: Diagnosis not present

## 2016-12-03 DIAGNOSIS — N8182 Incompetence or weakening of pubocervical tissue: Secondary | ICD-10-CM | POA: Diagnosis not present

## 2016-12-03 DIAGNOSIS — M859 Disorder of bone density and structure, unspecified: Secondary | ICD-10-CM | POA: Diagnosis not present

## 2016-12-09 DIAGNOSIS — M9901 Segmental and somatic dysfunction of cervical region: Secondary | ICD-10-CM | POA: Diagnosis not present

## 2016-12-09 DIAGNOSIS — M542 Cervicalgia: Secondary | ICD-10-CM | POA: Diagnosis not present

## 2016-12-09 DIAGNOSIS — S338XXA Sprain of other parts of lumbar spine and pelvis, initial encounter: Secondary | ICD-10-CM | POA: Diagnosis not present

## 2016-12-09 DIAGNOSIS — M9903 Segmental and somatic dysfunction of lumbar region: Secondary | ICD-10-CM | POA: Diagnosis not present

## 2016-12-21 DIAGNOSIS — M542 Cervicalgia: Secondary | ICD-10-CM | POA: Diagnosis not present

## 2016-12-21 DIAGNOSIS — S338XXA Sprain of other parts of lumbar spine and pelvis, initial encounter: Secondary | ICD-10-CM | POA: Diagnosis not present

## 2016-12-21 DIAGNOSIS — M9903 Segmental and somatic dysfunction of lumbar region: Secondary | ICD-10-CM | POA: Diagnosis not present

## 2016-12-21 DIAGNOSIS — M9901 Segmental and somatic dysfunction of cervical region: Secondary | ICD-10-CM | POA: Diagnosis not present

## 2017-01-05 DIAGNOSIS — S338XXA Sprain of other parts of lumbar spine and pelvis, initial encounter: Secondary | ICD-10-CM | POA: Diagnosis not present

## 2017-01-05 DIAGNOSIS — M542 Cervicalgia: Secondary | ICD-10-CM | POA: Diagnosis not present

## 2017-01-05 DIAGNOSIS — M9903 Segmental and somatic dysfunction of lumbar region: Secondary | ICD-10-CM | POA: Diagnosis not present

## 2017-01-05 DIAGNOSIS — M9901 Segmental and somatic dysfunction of cervical region: Secondary | ICD-10-CM | POA: Diagnosis not present

## 2017-01-15 DIAGNOSIS — M9903 Segmental and somatic dysfunction of lumbar region: Secondary | ICD-10-CM | POA: Diagnosis not present

## 2017-01-15 DIAGNOSIS — M9901 Segmental and somatic dysfunction of cervical region: Secondary | ICD-10-CM | POA: Diagnosis not present

## 2017-01-15 DIAGNOSIS — S338XXA Sprain of other parts of lumbar spine and pelvis, initial encounter: Secondary | ICD-10-CM | POA: Diagnosis not present

## 2017-01-15 DIAGNOSIS — M542 Cervicalgia: Secondary | ICD-10-CM | POA: Diagnosis not present

## 2017-01-21 DIAGNOSIS — M9901 Segmental and somatic dysfunction of cervical region: Secondary | ICD-10-CM | POA: Diagnosis not present

## 2017-01-21 DIAGNOSIS — M9903 Segmental and somatic dysfunction of lumbar region: Secondary | ICD-10-CM | POA: Diagnosis not present

## 2017-01-21 DIAGNOSIS — S338XXA Sprain of other parts of lumbar spine and pelvis, initial encounter: Secondary | ICD-10-CM | POA: Diagnosis not present

## 2017-01-21 DIAGNOSIS — M542 Cervicalgia: Secondary | ICD-10-CM | POA: Diagnosis not present

## 2017-01-28 ENCOUNTER — Telehealth: Payer: Self-pay | Admitting: Family Medicine

## 2017-01-28 ENCOUNTER — Other Ambulatory Visit: Payer: Self-pay

## 2017-01-28 NOTE — Telephone Encounter (Signed)
° ° ° °  Pt now uses [facility] and need the below med sent there    Neomycin-polymyxin hydrocortisone   [facility]

## 2017-01-28 NOTE — Telephone Encounter (Signed)
Dr. Sarajane Jews, is this ok to refill?

## 2017-02-01 NOTE — Telephone Encounter (Signed)
Yes please send this in  

## 2017-02-02 DIAGNOSIS — S338XXA Sprain of other parts of lumbar spine and pelvis, initial encounter: Secondary | ICD-10-CM | POA: Diagnosis not present

## 2017-02-02 DIAGNOSIS — M9901 Segmental and somatic dysfunction of cervical region: Secondary | ICD-10-CM | POA: Diagnosis not present

## 2017-02-02 DIAGNOSIS — M9903 Segmental and somatic dysfunction of lumbar region: Secondary | ICD-10-CM | POA: Diagnosis not present

## 2017-02-02 DIAGNOSIS — M542 Cervicalgia: Secondary | ICD-10-CM | POA: Diagnosis not present

## 2017-02-02 MED ORDER — NEOMYCIN-POLYMYXIN-HC 3.5-10000-1 OT SOLN: OTIC | 2 refills | Status: DC

## 2017-02-02 NOTE — Telephone Encounter (Signed)
Rx sent 

## 2017-02-03 ENCOUNTER — Other Ambulatory Visit: Payer: Self-pay | Admitting: Family Medicine

## 2017-02-12 ENCOUNTER — Telehealth: Payer: Self-pay | Admitting: Family Medicine

## 2017-02-12 ENCOUNTER — Encounter: Payer: Self-pay | Admitting: Family Medicine

## 2017-02-12 ENCOUNTER — Ambulatory Visit (INDEPENDENT_AMBULATORY_CARE_PROVIDER_SITE_OTHER): Payer: Medicare Other | Admitting: Family Medicine

## 2017-02-12 VITALS — BP 104/71 | HR 63 | Temp 98.4°F | Ht 63.5 in | Wt 146.0 lb

## 2017-02-12 DIAGNOSIS — E039 Hypothyroidism, unspecified: Secondary | ICD-10-CM | POA: Diagnosis not present

## 2017-02-12 DIAGNOSIS — H60541 Acute eczematoid otitis externa, right ear: Secondary | ICD-10-CM

## 2017-02-12 LAB — TSH: TSH: 1.45 u[IU]/mL (ref 0.35–4.50)

## 2017-02-12 MED ORDER — NEOMYCIN-POLYMYXIN-HC 3.5-10000-1 OT SOLN: OTIC | 3 refills | Status: DC

## 2017-02-12 MED ORDER — LEVOTHYROXINE SODIUM 25 MCG PO TABS: ORAL_TABLET | ORAL | 3 refills | Status: DC

## 2017-02-12 NOTE — Telephone Encounter (Signed)
Pt has been sch for monday

## 2017-02-12 NOTE — Telephone Encounter (Signed)
Per Dr. Sarajane Jews pt needs office visit

## 2017-02-12 NOTE — Telephone Encounter (Signed)
Pt needs new rx neomycin-polymyxin hydrocortisone ear drops send to . [facility]

## 2017-02-12 NOTE — Progress Notes (Signed)
   Subjective:    Patient ID: Christy Douglas, female    DOB: May 26, 1956, 61 y.o.   MRN: [id number]  HPI Here to have her thyroid checked and also to complain of right ear pain. She swims almost every day for exercise. No sinus pressure or fever.    Review of Systems  Constitutional: Negative.   HENT: Positive for ear pain. Negative for congestion, postnasal drip, sinus pain, sinus pressure and sore throat.   Eyes: Negative.   Respiratory: Negative.   Cardiovascular: Negative.        Objective:   Physical Exam  Constitutional: She appears well-developed and well-nourished.  HENT:  Left Ear: External ear normal.  Nose: Nose normal.  Mouth/Throat: Oropharynx is clear and moist.  Right ear canal is red and swollen. The TM is clear   Eyes: Conjunctivae are normal.  Neck: Neck supple. No thyromegaly present.  Pulmonary/Chest: Effort normal and breath sounds normal. No respiratory distress. She has no wheezes. She has no rales.  Lymphadenopathy:    She has no cervical adenopathy.          Assessment & Plan:  Treat the otitis externa with Cortisporin Otic drops. Check labs including the TSH.  Alysia Penna, MD

## 2017-02-12 NOTE — Patient Instructions (Signed)
WE NOW OFFER   [facility] FAST TRACK!!!  SAME DAY Appointments for ACUTE CARE  Such as: Sprains, Injuries, cuts, abrasions, rashes, muscle pain, joint pain, back pain Colds, flu, sore throats, headache, allergies, cough, fever  Ear pain, sinus and eye infections Abdominal pain, nausea, vomiting, diarrhea, upset stomach Animal/insect bites  3 Easy Ways to Schedule: Walk-In Scheduling Call in scheduling Mychart Sign-up: [email or web address]         

## 2017-02-15 ENCOUNTER — Ambulatory Visit: Payer: Medicare Other | Admitting: Family Medicine

## 2017-02-23 DIAGNOSIS — M542 Cervicalgia: Secondary | ICD-10-CM | POA: Diagnosis not present

## 2017-02-23 DIAGNOSIS — M9901 Segmental and somatic dysfunction of cervical region: Secondary | ICD-10-CM | POA: Diagnosis not present

## 2017-02-23 DIAGNOSIS — S338XXA Sprain of other parts of lumbar spine and pelvis, initial encounter: Secondary | ICD-10-CM | POA: Diagnosis not present

## 2017-02-23 DIAGNOSIS — M9903 Segmental and somatic dysfunction of lumbar region: Secondary | ICD-10-CM | POA: Diagnosis not present

## 2017-03-09 DIAGNOSIS — M9901 Segmental and somatic dysfunction of cervical region: Secondary | ICD-10-CM | POA: Diagnosis not present

## 2017-03-09 DIAGNOSIS — S338XXA Sprain of other parts of lumbar spine and pelvis, initial encounter: Secondary | ICD-10-CM | POA: Diagnosis not present

## 2017-03-09 DIAGNOSIS — M542 Cervicalgia: Secondary | ICD-10-CM | POA: Diagnosis not present

## 2017-03-09 DIAGNOSIS — M9903 Segmental and somatic dysfunction of lumbar region: Secondary | ICD-10-CM | POA: Diagnosis not present

## 2017-03-30 DIAGNOSIS — M542 Cervicalgia: Secondary | ICD-10-CM | POA: Diagnosis not present

## 2017-03-30 DIAGNOSIS — M9901 Segmental and somatic dysfunction of cervical region: Secondary | ICD-10-CM | POA: Diagnosis not present

## 2017-03-30 DIAGNOSIS — M9903 Segmental and somatic dysfunction of lumbar region: Secondary | ICD-10-CM | POA: Diagnosis not present

## 2017-03-30 DIAGNOSIS — S338XXA Sprain of other parts of lumbar spine and pelvis, initial encounter: Secondary | ICD-10-CM | POA: Diagnosis not present

## 2017-04-08 DIAGNOSIS — M542 Cervicalgia: Secondary | ICD-10-CM | POA: Diagnosis not present

## 2017-04-08 DIAGNOSIS — M9901 Segmental and somatic dysfunction of cervical region: Secondary | ICD-10-CM | POA: Diagnosis not present

## 2017-04-08 DIAGNOSIS — M9903 Segmental and somatic dysfunction of lumbar region: Secondary | ICD-10-CM | POA: Diagnosis not present

## 2017-04-08 DIAGNOSIS — S338XXA Sprain of other parts of lumbar spine and pelvis, initial encounter: Secondary | ICD-10-CM | POA: Diagnosis not present

## 2017-04-20 DIAGNOSIS — M542 Cervicalgia: Secondary | ICD-10-CM | POA: Diagnosis not present

## 2017-04-20 DIAGNOSIS — M9903 Segmental and somatic dysfunction of lumbar region: Secondary | ICD-10-CM | POA: Diagnosis not present

## 2017-04-20 DIAGNOSIS — S338XXA Sprain of other parts of lumbar spine and pelvis, initial encounter: Secondary | ICD-10-CM | POA: Diagnosis not present

## 2017-04-20 DIAGNOSIS — M9901 Segmental and somatic dysfunction of cervical region: Secondary | ICD-10-CM | POA: Diagnosis not present

## 2017-05-04 DIAGNOSIS — M9903 Segmental and somatic dysfunction of lumbar region: Secondary | ICD-10-CM | POA: Diagnosis not present

## 2017-05-04 DIAGNOSIS — S338XXA Sprain of other parts of lumbar spine and pelvis, initial encounter: Secondary | ICD-10-CM | POA: Diagnosis not present

## 2017-05-04 DIAGNOSIS — M9901 Segmental and somatic dysfunction of cervical region: Secondary | ICD-10-CM | POA: Diagnosis not present

## 2017-05-04 DIAGNOSIS — M542 Cervicalgia: Secondary | ICD-10-CM | POA: Diagnosis not present

## 2017-05-18 DIAGNOSIS — M9901 Segmental and somatic dysfunction of cervical region: Secondary | ICD-10-CM | POA: Diagnosis not present

## 2017-05-18 DIAGNOSIS — M9903 Segmental and somatic dysfunction of lumbar region: Secondary | ICD-10-CM | POA: Diagnosis not present

## 2017-05-18 DIAGNOSIS — S338XXA Sprain of other parts of lumbar spine and pelvis, initial encounter: Secondary | ICD-10-CM | POA: Diagnosis not present

## 2017-05-18 DIAGNOSIS — M542 Cervicalgia: Secondary | ICD-10-CM | POA: Diagnosis not present

## 2017-06-01 DIAGNOSIS — M542 Cervicalgia: Secondary | ICD-10-CM | POA: Diagnosis not present

## 2017-06-01 DIAGNOSIS — M9903 Segmental and somatic dysfunction of lumbar region: Secondary | ICD-10-CM | POA: Diagnosis not present

## 2017-06-01 DIAGNOSIS — M9901 Segmental and somatic dysfunction of cervical region: Secondary | ICD-10-CM | POA: Diagnosis not present

## 2017-06-01 DIAGNOSIS — S338XXA Sprain of other parts of lumbar spine and pelvis, initial encounter: Secondary | ICD-10-CM | POA: Diagnosis not present

## 2017-07-05 ENCOUNTER — Other Ambulatory Visit: Payer: Self-pay

## 2017-07-05 ENCOUNTER — Encounter (HOSPITAL_BASED_OUTPATIENT_CLINIC_OR_DEPARTMENT_OTHER): Payer: Self-pay | Admitting: *Deleted

## 2017-07-05 DIAGNOSIS — S52502A Unspecified fracture of the lower end of left radius, initial encounter for closed fracture: Secondary | ICD-10-CM | POA: Diagnosis present

## 2017-07-05 DIAGNOSIS — M25532 Pain in left wrist: Secondary | ICD-10-CM | POA: Diagnosis not present

## 2017-07-05 DIAGNOSIS — S62102A Fracture of unspecified carpal bone, left wrist, initial encounter for closed fracture: Secondary | ICD-10-CM | POA: Diagnosis not present

## 2017-07-08 ENCOUNTER — Ambulatory Visit (HOSPITAL_BASED_OUTPATIENT_CLINIC_OR_DEPARTMENT_OTHER): Admit: 2017-07-08 | Payer: Medicare Other | Admitting: Orthopedic Surgery

## 2017-07-08 DIAGNOSIS — S52572A Other intraarticular fracture of lower end of left radius, initial encounter for closed fracture: Secondary | ICD-10-CM | POA: Diagnosis not present

## 2017-07-08 DIAGNOSIS — X58XXXA Exposure to other specified factors, initial encounter: Secondary | ICD-10-CM | POA: Diagnosis not present

## 2017-07-08 DIAGNOSIS — S52502A Unspecified fracture of the lower end of left radius, initial encounter for closed fracture: Secondary | ICD-10-CM | POA: Diagnosis not present

## 2017-07-08 DIAGNOSIS — G8918 Other acute postprocedural pain: Secondary | ICD-10-CM | POA: Diagnosis not present

## 2017-07-08 HISTORY — DX: Fracture of unspecified carpal bone, left wrist, initial encounter for closed fracture: S62.102A

## 2017-07-08 SURGERY — OPEN REDUCTION INTERNAL FIXATION (ORIF) WRIST FRACTURE
Anesthesia: Choice | Laterality: Left

## 2017-07-19 DIAGNOSIS — S52502D Unspecified fracture of the lower end of left radius, subsequent encounter for closed fracture with routine healing: Secondary | ICD-10-CM | POA: Diagnosis not present

## 2017-08-11 DIAGNOSIS — M25531 Pain in right wrist: Secondary | ICD-10-CM | POA: Diagnosis not present

## 2017-08-11 DIAGNOSIS — S52502D Unspecified fracture of the lower end of left radius, subsequent encounter for closed fracture with routine healing: Secondary | ICD-10-CM | POA: Diagnosis not present

## 2017-08-12 DIAGNOSIS — Z01419 Encounter for gynecological examination (general) (routine) without abnormal findings: Secondary | ICD-10-CM | POA: Diagnosis not present

## 2017-08-12 DIAGNOSIS — Z1231 Encounter for screening mammogram for malignant neoplasm of breast: Secondary | ICD-10-CM | POA: Diagnosis not present

## 2017-09-08 DIAGNOSIS — S52502D Unspecified fracture of the lower end of left radius, subsequent encounter for closed fracture with routine healing: Secondary | ICD-10-CM | POA: Diagnosis not present

## 2017-09-09 DIAGNOSIS — S338XXA Sprain of other parts of lumbar spine and pelvis, initial encounter: Secondary | ICD-10-CM | POA: Diagnosis not present

## 2017-09-09 DIAGNOSIS — M9903 Segmental and somatic dysfunction of lumbar region: Secondary | ICD-10-CM | POA: Diagnosis not present

## 2017-09-09 DIAGNOSIS — M542 Cervicalgia: Secondary | ICD-10-CM | POA: Diagnosis not present

## 2017-09-09 DIAGNOSIS — M9901 Segmental and somatic dysfunction of cervical region: Secondary | ICD-10-CM | POA: Diagnosis not present

## 2017-09-30 ENCOUNTER — Encounter: Payer: Self-pay | Admitting: Family Medicine

## 2017-09-30 DIAGNOSIS — H5010 Unspecified exotropia: Secondary | ICD-10-CM | POA: Diagnosis not present

## 2017-09-30 DIAGNOSIS — M542 Cervicalgia: Secondary | ICD-10-CM | POA: Diagnosis not present

## 2017-09-30 DIAGNOSIS — M9903 Segmental and somatic dysfunction of lumbar region: Secondary | ICD-10-CM | POA: Diagnosis not present

## 2017-09-30 DIAGNOSIS — H04123 Dry eye syndrome of bilateral lacrimal glands: Secondary | ICD-10-CM | POA: Diagnosis not present

## 2017-09-30 DIAGNOSIS — S338XXA Sprain of other parts of lumbar spine and pelvis, initial encounter: Secondary | ICD-10-CM | POA: Diagnosis not present

## 2017-09-30 DIAGNOSIS — M9901 Segmental and somatic dysfunction of cervical region: Secondary | ICD-10-CM | POA: Diagnosis not present

## 2017-09-30 DIAGNOSIS — H5500 Unspecified nystagmus: Secondary | ICD-10-CM | POA: Diagnosis not present

## 2017-09-30 DIAGNOSIS — H40023 Open angle with borderline findings, high risk, bilateral: Secondary | ICD-10-CM | POA: Diagnosis not present

## 2017-10-14 DIAGNOSIS — M9901 Segmental and somatic dysfunction of cervical region: Secondary | ICD-10-CM | POA: Diagnosis not present

## 2017-10-14 DIAGNOSIS — M9903 Segmental and somatic dysfunction of lumbar region: Secondary | ICD-10-CM | POA: Diagnosis not present

## 2017-10-14 DIAGNOSIS — S338XXA Sprain of other parts of lumbar spine and pelvis, initial encounter: Secondary | ICD-10-CM | POA: Diagnosis not present

## 2017-10-14 DIAGNOSIS — H401222 Low-tension glaucoma, left eye, moderate stage: Secondary | ICD-10-CM | POA: Diagnosis not present

## 2017-10-14 DIAGNOSIS — H468 Other optic neuritis: Secondary | ICD-10-CM | POA: Diagnosis not present

## 2017-10-14 DIAGNOSIS — M542 Cervicalgia: Secondary | ICD-10-CM | POA: Diagnosis not present

## 2017-10-14 DIAGNOSIS — H04123 Dry eye syndrome of bilateral lacrimal glands: Secondary | ICD-10-CM | POA: Diagnosis not present

## 2017-10-14 DIAGNOSIS — H401213 Low-tension glaucoma, right eye, severe stage: Secondary | ICD-10-CM | POA: Diagnosis not present

## 2017-10-18 DIAGNOSIS — M9903 Segmental and somatic dysfunction of lumbar region: Secondary | ICD-10-CM | POA: Diagnosis not present

## 2017-10-18 DIAGNOSIS — M9901 Segmental and somatic dysfunction of cervical region: Secondary | ICD-10-CM | POA: Diagnosis not present

## 2017-10-18 DIAGNOSIS — M542 Cervicalgia: Secondary | ICD-10-CM | POA: Diagnosis not present

## 2017-10-18 DIAGNOSIS — S338XXA Sprain of other parts of lumbar spine and pelvis, initial encounter: Secondary | ICD-10-CM | POA: Diagnosis not present

## 2017-10-19 DIAGNOSIS — H401213 Low-tension glaucoma, right eye, severe stage: Secondary | ICD-10-CM | POA: Diagnosis not present

## 2017-11-06 ENCOUNTER — Other Ambulatory Visit: Payer: Self-pay | Admitting: Family Medicine

## 2017-11-08 DIAGNOSIS — M9901 Segmental and somatic dysfunction of cervical region: Secondary | ICD-10-CM | POA: Diagnosis not present

## 2017-11-08 DIAGNOSIS — S338XXA Sprain of other parts of lumbar spine and pelvis, initial encounter: Secondary | ICD-10-CM | POA: Diagnosis not present

## 2017-11-08 DIAGNOSIS — M542 Cervicalgia: Secondary | ICD-10-CM | POA: Diagnosis not present

## 2017-11-08 DIAGNOSIS — M9903 Segmental and somatic dysfunction of lumbar region: Secondary | ICD-10-CM | POA: Diagnosis not present

## 2017-11-18 DIAGNOSIS — H468 Other optic neuritis: Secondary | ICD-10-CM | POA: Diagnosis not present

## 2017-11-18 DIAGNOSIS — H401222 Low-tension glaucoma, left eye, moderate stage: Secondary | ICD-10-CM | POA: Diagnosis not present

## 2017-11-18 DIAGNOSIS — H401213 Low-tension glaucoma, right eye, severe stage: Secondary | ICD-10-CM | POA: Diagnosis not present

## 2017-11-22 DIAGNOSIS — M9901 Segmental and somatic dysfunction of cervical region: Secondary | ICD-10-CM | POA: Diagnosis not present

## 2017-11-22 DIAGNOSIS — M9903 Segmental and somatic dysfunction of lumbar region: Secondary | ICD-10-CM | POA: Diagnosis not present

## 2017-11-22 DIAGNOSIS — S338XXA Sprain of other parts of lumbar spine and pelvis, initial encounter: Secondary | ICD-10-CM | POA: Diagnosis not present

## 2017-11-22 DIAGNOSIS — M542 Cervicalgia: Secondary | ICD-10-CM | POA: Diagnosis not present

## 2017-11-29 DIAGNOSIS — S52502D Unspecified fracture of the lower end of left radius, subsequent encounter for closed fracture with routine healing: Secondary | ICD-10-CM | POA: Diagnosis not present

## 2017-11-29 DIAGNOSIS — M25531 Pain in right wrist: Secondary | ICD-10-CM | POA: Diagnosis not present

## 2017-12-02 DIAGNOSIS — L84 Corns and callosities: Secondary | ICD-10-CM | POA: Diagnosis not present

## 2017-12-02 DIAGNOSIS — D2371 Other benign neoplasm of skin of right lower limb, including hip: Secondary | ICD-10-CM | POA: Diagnosis not present

## 2017-12-02 DIAGNOSIS — D2372 Other benign neoplasm of skin of left lower limb, including hip: Secondary | ICD-10-CM | POA: Diagnosis not present

## 2017-12-13 DIAGNOSIS — M9903 Segmental and somatic dysfunction of lumbar region: Secondary | ICD-10-CM | POA: Diagnosis not present

## 2017-12-13 DIAGNOSIS — S338XXA Sprain of other parts of lumbar spine and pelvis, initial encounter: Secondary | ICD-10-CM | POA: Diagnosis not present

## 2017-12-13 DIAGNOSIS — M9901 Segmental and somatic dysfunction of cervical region: Secondary | ICD-10-CM | POA: Diagnosis not present

## 2017-12-13 DIAGNOSIS — M542 Cervicalgia: Secondary | ICD-10-CM | POA: Diagnosis not present

## 2018-01-04 DIAGNOSIS — M9903 Segmental and somatic dysfunction of lumbar region: Secondary | ICD-10-CM | POA: Diagnosis not present

## 2018-01-04 DIAGNOSIS — M542 Cervicalgia: Secondary | ICD-10-CM | POA: Diagnosis not present

## 2018-01-04 DIAGNOSIS — M9901 Segmental and somatic dysfunction of cervical region: Secondary | ICD-10-CM | POA: Diagnosis not present

## 2018-01-04 DIAGNOSIS — S338XXA Sprain of other parts of lumbar spine and pelvis, initial encounter: Secondary | ICD-10-CM | POA: Diagnosis not present

## 2018-01-25 DIAGNOSIS — S338XXA Sprain of other parts of lumbar spine and pelvis, initial encounter: Secondary | ICD-10-CM | POA: Diagnosis not present

## 2018-01-25 DIAGNOSIS — M9901 Segmental and somatic dysfunction of cervical region: Secondary | ICD-10-CM | POA: Diagnosis not present

## 2018-01-25 DIAGNOSIS — M542 Cervicalgia: Secondary | ICD-10-CM | POA: Diagnosis not present

## 2018-01-25 DIAGNOSIS — M9903 Segmental and somatic dysfunction of lumbar region: Secondary | ICD-10-CM | POA: Diagnosis not present

## 2018-02-15 DIAGNOSIS — S338XXA Sprain of other parts of lumbar spine and pelvis, initial encounter: Secondary | ICD-10-CM | POA: Diagnosis not present

## 2018-02-15 DIAGNOSIS — M542 Cervicalgia: Secondary | ICD-10-CM | POA: Diagnosis not present

## 2018-02-15 DIAGNOSIS — M9903 Segmental and somatic dysfunction of lumbar region: Secondary | ICD-10-CM | POA: Diagnosis not present

## 2018-02-15 DIAGNOSIS — M9901 Segmental and somatic dysfunction of cervical region: Secondary | ICD-10-CM | POA: Diagnosis not present

## 2018-02-17 DIAGNOSIS — H401213 Low-tension glaucoma, right eye, severe stage: Secondary | ICD-10-CM | POA: Diagnosis not present

## 2018-02-17 DIAGNOSIS — H468 Other optic neuritis: Secondary | ICD-10-CM | POA: Diagnosis not present

## 2018-02-17 DIAGNOSIS — H401222 Low-tension glaucoma, left eye, moderate stage: Secondary | ICD-10-CM | POA: Diagnosis not present

## 2018-03-01 DIAGNOSIS — M9903 Segmental and somatic dysfunction of lumbar region: Secondary | ICD-10-CM | POA: Diagnosis not present

## 2018-03-01 DIAGNOSIS — M542 Cervicalgia: Secondary | ICD-10-CM | POA: Diagnosis not present

## 2018-03-01 DIAGNOSIS — S338XXA Sprain of other parts of lumbar spine and pelvis, initial encounter: Secondary | ICD-10-CM | POA: Diagnosis not present

## 2018-03-01 DIAGNOSIS — M9901 Segmental and somatic dysfunction of cervical region: Secondary | ICD-10-CM | POA: Diagnosis not present

## 2018-03-21 DIAGNOSIS — M9901 Segmental and somatic dysfunction of cervical region: Secondary | ICD-10-CM | POA: Diagnosis not present

## 2018-03-21 DIAGNOSIS — M542 Cervicalgia: Secondary | ICD-10-CM | POA: Diagnosis not present

## 2018-03-21 DIAGNOSIS — S338XXA Sprain of other parts of lumbar spine and pelvis, initial encounter: Secondary | ICD-10-CM | POA: Diagnosis not present

## 2018-03-21 DIAGNOSIS — M9903 Segmental and somatic dysfunction of lumbar region: Secondary | ICD-10-CM | POA: Diagnosis not present

## 2018-04-07 DIAGNOSIS — M9903 Segmental and somatic dysfunction of lumbar region: Secondary | ICD-10-CM | POA: Diagnosis not present

## 2018-04-07 DIAGNOSIS — M9901 Segmental and somatic dysfunction of cervical region: Secondary | ICD-10-CM | POA: Diagnosis not present

## 2018-04-07 DIAGNOSIS — M542 Cervicalgia: Secondary | ICD-10-CM | POA: Diagnosis not present

## 2018-04-07 DIAGNOSIS — S338XXA Sprain of other parts of lumbar spine and pelvis, initial encounter: Secondary | ICD-10-CM | POA: Diagnosis not present

## 2018-04-26 DIAGNOSIS — S338XXA Sprain of other parts of lumbar spine and pelvis, initial encounter: Secondary | ICD-10-CM | POA: Diagnosis not present

## 2018-04-26 DIAGNOSIS — M9901 Segmental and somatic dysfunction of cervical region: Secondary | ICD-10-CM | POA: Diagnosis not present

## 2018-04-26 DIAGNOSIS — M542 Cervicalgia: Secondary | ICD-10-CM | POA: Diagnosis not present

## 2018-04-26 DIAGNOSIS — M9903 Segmental and somatic dysfunction of lumbar region: Secondary | ICD-10-CM | POA: Diagnosis not present

## 2018-05-17 DIAGNOSIS — M542 Cervicalgia: Secondary | ICD-10-CM | POA: Diagnosis not present

## 2018-05-17 DIAGNOSIS — M9901 Segmental and somatic dysfunction of cervical region: Secondary | ICD-10-CM | POA: Diagnosis not present

## 2018-05-17 DIAGNOSIS — S338XXA Sprain of other parts of lumbar spine and pelvis, initial encounter: Secondary | ICD-10-CM | POA: Diagnosis not present

## 2018-05-17 DIAGNOSIS — M9903 Segmental and somatic dysfunction of lumbar region: Secondary | ICD-10-CM | POA: Diagnosis not present

## 2018-05-24 ENCOUNTER — Encounter: Payer: Self-pay | Admitting: Family Medicine

## 2018-05-24 ENCOUNTER — Ambulatory Visit (INDEPENDENT_AMBULATORY_CARE_PROVIDER_SITE_OTHER): Payer: Medicare Other | Admitting: Family Medicine

## 2018-05-24 VITALS — BP 116/78 | HR 68 | Temp 98.2°F | Wt 144.3 lb

## 2018-05-24 DIAGNOSIS — J019 Acute sinusitis, unspecified: Secondary | ICD-10-CM | POA: Diagnosis not present

## 2018-05-24 MED ORDER — LEVOFLOXACIN 500 MG PO TABS: 500.0000 mg | ORAL_TABLET | Freq: Every day | ORAL | 0 refills | Status: AC

## 2018-05-24 NOTE — Progress Notes (Signed)
   Subjective:    Patient ID: ROLANDA CAMPA, female    DOB: [birth year], 62 y.o.   MRN: [id number]  HPI Here for 2 weeks of ST, stuffy head, PND, and a dry cough. No fever.    Review of Systems  Constitutional: Negative.   HENT: Positive for congestion, postnasal drip, sinus pressure and sore throat. Negative for ear pain and sinus pain.   Eyes: Negative.   Respiratory: Positive for cough.        Objective:   Physical Exam  Constitutional: She appears well-developed and well-nourished.  HENT:  Right Ear: External ear normal.  Left Ear: External ear normal.  Nose: Nose normal.  Mouth/Throat: Oropharynx is clear and moist.  Eyes: Conjunctivae are normal.  Neck: No thyromegaly present.  Pulmonary/Chest: Effort normal and breath sounds normal. No stridor. No respiratory distress. She has no wheezes. She has no rales.  Lymphadenopathy:    She has no cervical adenopathy.          Assessment & Plan:  Sinusitis, treat with Levaquin and Mucinex.Alysia Penna, MD

## 2018-06-07 DIAGNOSIS — M9903 Segmental and somatic dysfunction of lumbar region: Secondary | ICD-10-CM | POA: Diagnosis not present

## 2018-06-07 DIAGNOSIS — M542 Cervicalgia: Secondary | ICD-10-CM | POA: Diagnosis not present

## 2018-06-07 DIAGNOSIS — S338XXA Sprain of other parts of lumbar spine and pelvis, initial encounter: Secondary | ICD-10-CM | POA: Diagnosis not present

## 2018-06-07 DIAGNOSIS — M9901 Segmental and somatic dysfunction of cervical region: Secondary | ICD-10-CM | POA: Diagnosis not present

## 2018-06-28 DIAGNOSIS — S338XXA Sprain of other parts of lumbar spine and pelvis, initial encounter: Secondary | ICD-10-CM | POA: Diagnosis not present

## 2018-06-28 DIAGNOSIS — M9903 Segmental and somatic dysfunction of lumbar region: Secondary | ICD-10-CM | POA: Diagnosis not present

## 2018-06-28 DIAGNOSIS — M9901 Segmental and somatic dysfunction of cervical region: Secondary | ICD-10-CM | POA: Diagnosis not present

## 2018-06-28 DIAGNOSIS — M542 Cervicalgia: Secondary | ICD-10-CM | POA: Diagnosis not present

## 2018-07-21 DIAGNOSIS — M542 Cervicalgia: Secondary | ICD-10-CM | POA: Diagnosis not present

## 2018-07-21 DIAGNOSIS — M9901 Segmental and somatic dysfunction of cervical region: Secondary | ICD-10-CM | POA: Diagnosis not present

## 2018-07-21 DIAGNOSIS — S338XXA Sprain of other parts of lumbar spine and pelvis, initial encounter: Secondary | ICD-10-CM | POA: Diagnosis not present

## 2018-07-21 DIAGNOSIS — M9903 Segmental and somatic dysfunction of lumbar region: Secondary | ICD-10-CM | POA: Diagnosis not present

## 2018-08-09 DIAGNOSIS — M9903 Segmental and somatic dysfunction of lumbar region: Secondary | ICD-10-CM | POA: Diagnosis not present

## 2018-08-09 DIAGNOSIS — S338XXA Sprain of other parts of lumbar spine and pelvis, initial encounter: Secondary | ICD-10-CM | POA: Diagnosis not present

## 2018-08-09 DIAGNOSIS — M9901 Segmental and somatic dysfunction of cervical region: Secondary | ICD-10-CM | POA: Diagnosis not present

## 2018-08-09 DIAGNOSIS — M542 Cervicalgia: Secondary | ICD-10-CM | POA: Diagnosis not present

## 2018-08-25 DIAGNOSIS — H468 Other optic neuritis: Secondary | ICD-10-CM | POA: Diagnosis not present

## 2018-08-25 DIAGNOSIS — H401213 Low-tension glaucoma, right eye, severe stage: Secondary | ICD-10-CM | POA: Diagnosis not present

## 2018-08-25 DIAGNOSIS — H0520 Unspecified exophthalmos: Secondary | ICD-10-CM | POA: Diagnosis not present

## 2018-08-25 DIAGNOSIS — H401222 Low-tension glaucoma, left eye, moderate stage: Secondary | ICD-10-CM | POA: Diagnosis not present

## 2018-08-29 ENCOUNTER — Other Ambulatory Visit: Payer: Self-pay | Admitting: Surgery

## 2018-08-29 DIAGNOSIS — H0520 Unspecified exophthalmos: Secondary | ICD-10-CM

## 2018-08-29 DIAGNOSIS — H468 Other optic neuritis: Secondary | ICD-10-CM

## 2018-08-30 ENCOUNTER — Encounter: Payer: Self-pay | Admitting: Family Medicine

## 2018-08-30 ENCOUNTER — Ambulatory Visit (INDEPENDENT_AMBULATORY_CARE_PROVIDER_SITE_OTHER): Payer: Medicare Other | Admitting: Family Medicine

## 2018-08-30 VITALS — BP 108/68 | HR 68 | Temp 98.0°F | Wt 146.1 lb

## 2018-08-30 DIAGNOSIS — S338XXA Sprain of other parts of lumbar spine and pelvis, initial encounter: Secondary | ICD-10-CM | POA: Diagnosis not present

## 2018-08-30 DIAGNOSIS — M9903 Segmental and somatic dysfunction of lumbar region: Secondary | ICD-10-CM | POA: Diagnosis not present

## 2018-08-30 DIAGNOSIS — L309 Dermatitis, unspecified: Secondary | ICD-10-CM | POA: Diagnosis not present

## 2018-08-30 DIAGNOSIS — M542 Cervicalgia: Secondary | ICD-10-CM | POA: Diagnosis not present

## 2018-08-30 DIAGNOSIS — M9901 Segmental and somatic dysfunction of cervical region: Secondary | ICD-10-CM | POA: Diagnosis not present

## 2018-08-30 MED ORDER — TRIAMCINOLONE ACETONIDE 0.025 % EX CREA: 2.0000 "application " | TOPICAL_CREAM | Freq: Two times a day (BID) | CUTANEOUS | 5 refills | Status: DC

## 2018-08-30 MED ORDER — METHYLPREDNISOLONE ACETATE 80 MG/ML IJ SUSP
120.0000 mg | Freq: Once | INTRAMUSCULAR | Status: AC
  Administered 2018-08-30: 120 mg via INTRAMUSCULAR

## 2018-08-30 MED ORDER — TACROLIMUS 0.1 % EX OINT: TOPICAL_OINTMENT | Freq: Two times a day (BID) | CUTANEOUS | 5 refills | Status: DC

## 2018-08-30 NOTE — Progress Notes (Signed)
   Subjective:    Patient ID: Christy Douglas, female    DOB: 18-Aug-1955, 63 y.o.   MRN: [id number]  HPI Here for several weeks of dry, red, itchy skin on the neck and hands. She has a hx of eczema.    Review of Systems  Constitutional: Negative.   Respiratory: Negative.   Cardiovascular: Negative.   Skin: Positive for rash.       Objective:   Physical Exam Constitutional:      Appearance: Normal appearance.  Cardiovascular:     Rate and Rhythm: Normal rate and regular rhythm.     Pulses: Normal pulses.     Heart sounds: Normal heart sounds.  Pulmonary:     Effort: Pulmonary effort is normal.     Breath sounds: Normal breath sounds.  Skin:    Comments: The anterior neck and the backs of both hands have patches of macular erythema and scaling   Neurological:     Mental Status: She is alert.           Assessment & Plan:  Eczema, given a steroid shot and she will apply Triamcinolone cream bid as needed. Add a moisturizer like Lubriderm frequently.  Alysia Penna, MD

## 2018-08-30 NOTE — Addendum Note (Signed)
Addended by: Elie Confer on: 08/30/2018 01:59 PM   Modules accepted: Orders

## 2018-09-05 ENCOUNTER — Ambulatory Visit
Admission: RE | Admit: 2018-09-05 | Discharge: 2018-09-05 | Disposition: A | Discharge status: Home / Self Care | Payer: Medicare Other | Source: Ambulatory Visit | Attending: Surgery | Admitting: Surgery

## 2018-09-05 DIAGNOSIS — H47099 Other disorders of optic nerve, not elsewhere classified, unspecified eye: Secondary | ICD-10-CM

## 2018-09-05 DIAGNOSIS — H468 Other optic neuritis: Secondary | ICD-10-CM

## 2018-09-05 DIAGNOSIS — H0520 Unspecified exophthalmos: Secondary | ICD-10-CM

## 2018-09-05 MED ORDER — GADOBENATE DIMEGLUMINE 529 MG/ML IV SOLN
13.0000 mL | Freq: Once | INTRAVENOUS | Status: AC | PRN
  Administered 2018-09-05: 13 mL via INTRAVENOUS

## 2018-09-12 DIAGNOSIS — H468 Other optic neuritis: Secondary | ICD-10-CM | POA: Diagnosis not present

## 2018-09-12 DIAGNOSIS — H401222 Low-tension glaucoma, left eye, moderate stage: Secondary | ICD-10-CM | POA: Diagnosis not present

## 2018-09-12 DIAGNOSIS — H401213 Low-tension glaucoma, right eye, severe stage: Secondary | ICD-10-CM | POA: Diagnosis not present

## 2018-09-20 DIAGNOSIS — M542 Cervicalgia: Secondary | ICD-10-CM | POA: Diagnosis not present

## 2018-09-20 DIAGNOSIS — S338XXA Sprain of other parts of lumbar spine and pelvis, initial encounter: Secondary | ICD-10-CM | POA: Diagnosis not present

## 2018-09-20 DIAGNOSIS — M9903 Segmental and somatic dysfunction of lumbar region: Secondary | ICD-10-CM | POA: Diagnosis not present

## 2018-09-20 DIAGNOSIS — M9901 Segmental and somatic dysfunction of cervical region: Secondary | ICD-10-CM | POA: Diagnosis not present

## 2018-10-11 DIAGNOSIS — M542 Cervicalgia: Secondary | ICD-10-CM | POA: Diagnosis not present

## 2018-10-11 DIAGNOSIS — S338XXA Sprain of other parts of lumbar spine and pelvis, initial encounter: Secondary | ICD-10-CM | POA: Diagnosis not present

## 2018-10-11 DIAGNOSIS — M9901 Segmental and somatic dysfunction of cervical region: Secondary | ICD-10-CM | POA: Diagnosis not present

## 2018-10-11 DIAGNOSIS — M9903 Segmental and somatic dysfunction of lumbar region: Secondary | ICD-10-CM | POA: Diagnosis not present

## 2018-10-27 ENCOUNTER — Other Ambulatory Visit: Payer: Self-pay | Admitting: Family Medicine

## 2018-11-30 DIAGNOSIS — M542 Cervicalgia: Secondary | ICD-10-CM | POA: Diagnosis not present

## 2018-11-30 DIAGNOSIS — M9903 Segmental and somatic dysfunction of lumbar region: Secondary | ICD-10-CM | POA: Diagnosis not present

## 2018-11-30 DIAGNOSIS — S338XXA Sprain of other parts of lumbar spine and pelvis, initial encounter: Secondary | ICD-10-CM | POA: Diagnosis not present

## 2018-11-30 DIAGNOSIS — M9901 Segmental and somatic dysfunction of cervical region: Secondary | ICD-10-CM | POA: Diagnosis not present

## 2018-12-16 DIAGNOSIS — M542 Cervicalgia: Secondary | ICD-10-CM | POA: Diagnosis not present

## 2018-12-16 DIAGNOSIS — M9903 Segmental and somatic dysfunction of lumbar region: Secondary | ICD-10-CM | POA: Diagnosis not present

## 2018-12-16 DIAGNOSIS — S338XXA Sprain of other parts of lumbar spine and pelvis, initial encounter: Secondary | ICD-10-CM | POA: Diagnosis not present

## 2018-12-16 DIAGNOSIS — M9901 Segmental and somatic dysfunction of cervical region: Secondary | ICD-10-CM | POA: Diagnosis not present

## 2019-01-05 DIAGNOSIS — S338XXA Sprain of other parts of lumbar spine and pelvis, initial encounter: Secondary | ICD-10-CM | POA: Diagnosis not present

## 2019-01-05 DIAGNOSIS — M9903 Segmental and somatic dysfunction of lumbar region: Secondary | ICD-10-CM | POA: Diagnosis not present

## 2019-01-05 DIAGNOSIS — M9901 Segmental and somatic dysfunction of cervical region: Secondary | ICD-10-CM | POA: Diagnosis not present

## 2019-01-05 DIAGNOSIS — M542 Cervicalgia: Secondary | ICD-10-CM | POA: Diagnosis not present

## 2019-01-13 DIAGNOSIS — Z1231 Encounter for screening mammogram for malignant neoplasm of breast: Secondary | ICD-10-CM | POA: Diagnosis not present

## 2019-01-19 DIAGNOSIS — M542 Cervicalgia: Secondary | ICD-10-CM | POA: Diagnosis not present

## 2019-01-19 DIAGNOSIS — S338XXA Sprain of other parts of lumbar spine and pelvis, initial encounter: Secondary | ICD-10-CM | POA: Diagnosis not present

## 2019-01-19 DIAGNOSIS — M9903 Segmental and somatic dysfunction of lumbar region: Secondary | ICD-10-CM | POA: Diagnosis not present

## 2019-01-19 DIAGNOSIS — M9901 Segmental and somatic dysfunction of cervical region: Secondary | ICD-10-CM | POA: Diagnosis not present

## 2019-02-09 DIAGNOSIS — M542 Cervicalgia: Secondary | ICD-10-CM | POA: Diagnosis not present

## 2019-02-09 DIAGNOSIS — M9901 Segmental and somatic dysfunction of cervical region: Secondary | ICD-10-CM | POA: Diagnosis not present

## 2019-02-09 DIAGNOSIS — M9903 Segmental and somatic dysfunction of lumbar region: Secondary | ICD-10-CM | POA: Diagnosis not present

## 2019-02-09 DIAGNOSIS — S338XXA Sprain of other parts of lumbar spine and pelvis, initial encounter: Secondary | ICD-10-CM | POA: Diagnosis not present

## 2019-02-15 DIAGNOSIS — H469 Unspecified optic neuritis: Secondary | ICD-10-CM | POA: Diagnosis not present

## 2019-02-15 DIAGNOSIS — H0589 Other disorders of orbit: Secondary | ICD-10-CM | POA: Diagnosis not present

## 2019-02-15 DIAGNOSIS — H2513 Age-related nuclear cataract, bilateral: Secondary | ICD-10-CM | POA: Diagnosis not present

## 2019-02-15 DIAGNOSIS — H5010 Unspecified exotropia: Secondary | ICD-10-CM | POA: Diagnosis not present

## 2019-02-15 DIAGNOSIS — H5340 Unspecified visual field defects: Secondary | ICD-10-CM | POA: Diagnosis not present

## 2019-02-15 DIAGNOSIS — Z87820 Personal history of traumatic brain injury: Secondary | ICD-10-CM | POA: Diagnosis not present

## 2019-02-17 DIAGNOSIS — H0589 Other disorders of orbit: Secondary | ICD-10-CM | POA: Diagnosis not present

## 2019-02-17 DIAGNOSIS — H469 Unspecified optic neuritis: Secondary | ICD-10-CM | POA: Diagnosis not present

## 2019-02-17 DIAGNOSIS — H401110 Primary open-angle glaucoma, right eye, stage unspecified: Secondary | ICD-10-CM | POA: Diagnosis not present

## 2019-02-28 DIAGNOSIS — M9901 Segmental and somatic dysfunction of cervical region: Secondary | ICD-10-CM | POA: Diagnosis not present

## 2019-02-28 DIAGNOSIS — S338XXA Sprain of other parts of lumbar spine and pelvis, initial encounter: Secondary | ICD-10-CM | POA: Diagnosis not present

## 2019-02-28 DIAGNOSIS — M542 Cervicalgia: Secondary | ICD-10-CM | POA: Diagnosis not present

## 2019-02-28 DIAGNOSIS — M9903 Segmental and somatic dysfunction of lumbar region: Secondary | ICD-10-CM | POA: Diagnosis not present

## 2019-03-21 DIAGNOSIS — M9903 Segmental and somatic dysfunction of lumbar region: Secondary | ICD-10-CM | POA: Diagnosis not present

## 2019-03-21 DIAGNOSIS — M9901 Segmental and somatic dysfunction of cervical region: Secondary | ICD-10-CM | POA: Diagnosis not present

## 2019-03-21 DIAGNOSIS — M542 Cervicalgia: Secondary | ICD-10-CM | POA: Diagnosis not present

## 2019-03-21 DIAGNOSIS — S338XXA Sprain of other parts of lumbar spine and pelvis, initial encounter: Secondary | ICD-10-CM | POA: Diagnosis not present

## 2019-04-04 DIAGNOSIS — S338XXA Sprain of other parts of lumbar spine and pelvis, initial encounter: Secondary | ICD-10-CM | POA: Diagnosis not present

## 2019-04-04 DIAGNOSIS — M9901 Segmental and somatic dysfunction of cervical region: Secondary | ICD-10-CM | POA: Diagnosis not present

## 2019-04-04 DIAGNOSIS — M9903 Segmental and somatic dysfunction of lumbar region: Secondary | ICD-10-CM | POA: Diagnosis not present

## 2019-04-04 DIAGNOSIS — M542 Cervicalgia: Secondary | ICD-10-CM | POA: Diagnosis not present

## 2019-04-18 DIAGNOSIS — S338XXA Sprain of other parts of lumbar spine and pelvis, initial encounter: Secondary | ICD-10-CM | POA: Diagnosis not present

## 2019-04-18 DIAGNOSIS — M9903 Segmental and somatic dysfunction of lumbar region: Secondary | ICD-10-CM | POA: Diagnosis not present

## 2019-04-18 DIAGNOSIS — M9901 Segmental and somatic dysfunction of cervical region: Secondary | ICD-10-CM | POA: Diagnosis not present

## 2019-04-18 DIAGNOSIS — M542 Cervicalgia: Secondary | ICD-10-CM | POA: Diagnosis not present

## 2019-04-20 DIAGNOSIS — H47011 Ischemic optic neuropathy, right eye: Secondary | ICD-10-CM | POA: Diagnosis not present

## 2019-04-20 DIAGNOSIS — H0589 Other disorders of orbit: Secondary | ICD-10-CM | POA: Diagnosis not present

## 2019-04-27 DIAGNOSIS — H0589 Other disorders of orbit: Secondary | ICD-10-CM | POA: Diagnosis not present

## 2019-05-01 DIAGNOSIS — S338XXA Sprain of other parts of lumbar spine and pelvis, initial encounter: Secondary | ICD-10-CM | POA: Diagnosis not present

## 2019-05-01 DIAGNOSIS — M9903 Segmental and somatic dysfunction of lumbar region: Secondary | ICD-10-CM | POA: Diagnosis not present

## 2019-05-01 DIAGNOSIS — M9901 Segmental and somatic dysfunction of cervical region: Secondary | ICD-10-CM | POA: Diagnosis not present

## 2019-05-01 DIAGNOSIS — M542 Cervicalgia: Secondary | ICD-10-CM | POA: Diagnosis not present

## 2019-05-15 DIAGNOSIS — M9901 Segmental and somatic dysfunction of cervical region: Secondary | ICD-10-CM | POA: Diagnosis not present

## 2019-05-15 DIAGNOSIS — M542 Cervicalgia: Secondary | ICD-10-CM | POA: Diagnosis not present

## 2019-05-15 DIAGNOSIS — S338XXA Sprain of other parts of lumbar spine and pelvis, initial encounter: Secondary | ICD-10-CM | POA: Diagnosis not present

## 2019-05-15 DIAGNOSIS — M9903 Segmental and somatic dysfunction of lumbar region: Secondary | ICD-10-CM | POA: Diagnosis not present

## 2019-05-25 DIAGNOSIS — I868 Varicose veins of other specified sites: Secondary | ICD-10-CM | POA: Diagnosis not present

## 2019-05-25 DIAGNOSIS — H0589 Other disorders of orbit: Secondary | ICD-10-CM | POA: Diagnosis not present

## 2019-06-05 ENCOUNTER — Other Ambulatory Visit: Payer: Self-pay | Admitting: Family Medicine

## 2019-06-08 DIAGNOSIS — M9901 Segmental and somatic dysfunction of cervical region: Secondary | ICD-10-CM | POA: Diagnosis not present

## 2019-06-08 DIAGNOSIS — M542 Cervicalgia: Secondary | ICD-10-CM | POA: Diagnosis not present

## 2019-06-08 DIAGNOSIS — S338XXA Sprain of other parts of lumbar spine and pelvis, initial encounter: Secondary | ICD-10-CM | POA: Diagnosis not present

## 2019-06-08 DIAGNOSIS — M9903 Segmental and somatic dysfunction of lumbar region: Secondary | ICD-10-CM | POA: Diagnosis not present

## 2019-06-21 ENCOUNTER — Other Ambulatory Visit: Payer: Self-pay | Admitting: Family Medicine

## 2019-06-22 NOTE — Telephone Encounter (Signed)
Left message to return phone call. Pt needs

## 2019-06-22 NOTE — Telephone Encounter (Signed)
Pt needs TSH labs and ov.

## 2019-06-27 DIAGNOSIS — M9901 Segmental and somatic dysfunction of cervical region: Secondary | ICD-10-CM | POA: Diagnosis not present

## 2019-06-27 DIAGNOSIS — M542 Cervicalgia: Secondary | ICD-10-CM | POA: Diagnosis not present

## 2019-06-27 DIAGNOSIS — S338XXA Sprain of other parts of lumbar spine and pelvis, initial encounter: Secondary | ICD-10-CM | POA: Diagnosis not present

## 2019-06-27 DIAGNOSIS — M9903 Segmental and somatic dysfunction of lumbar region: Secondary | ICD-10-CM | POA: Diagnosis not present

## 2019-06-28 ENCOUNTER — Other Ambulatory Visit: Payer: Self-pay

## 2019-06-28 ENCOUNTER — Encounter: Payer: Self-pay | Admitting: Family Medicine

## 2019-06-28 ENCOUNTER — Ambulatory Visit (INDEPENDENT_AMBULATORY_CARE_PROVIDER_SITE_OTHER): Payer: Medicare Other | Admitting: Family Medicine

## 2019-06-28 VITALS — BP 112/78 | HR 66 | Temp 97.7°F | Wt 154.0 lb

## 2019-06-28 DIAGNOSIS — E039 Hypothyroidism, unspecified: Secondary | ICD-10-CM

## 2019-06-28 DIAGNOSIS — M810 Age-related osteoporosis without current pathological fracture: Secondary | ICD-10-CM

## 2019-06-28 LAB — BASIC METABOLIC PANEL
BUN: 21 mg/dL (ref 6–23)
CO2: 29 mEq/L (ref 19–32)
Calcium: 11.9 mg/dL — ABNORMAL HIGH (ref 8.4–10.5)
Chloride: 103 mEq/L (ref 96–112)
Creatinine, Ser: 0.64 mg/dL (ref 0.40–1.20)
GFR: 94.26 mL/min (ref >60.00)
Glucose, Bld: 81 mg/dL (ref 70–99)
Potassium: 4.7 mEq/L (ref 3.5–5.1)
Sodium: 138 mEq/L (ref 135–145)

## 2019-06-28 LAB — CBC WITH DIFFERENTIAL/PLATELET
Basophils Absolute: 0 10*3/uL (ref 0.0–0.1)
Basophils Relative: 0.5 % (ref 0.0–3.0)
Eosinophils Absolute: 0.2 10*3/uL (ref 0.0–0.7)
Eosinophils Relative: 3.4 % (ref 0.0–5.0)
HCT: 42.4 % (ref 36.0–46.0)
Hemoglobin: 14.3 g/dL (ref 12.0–15.0)
Lymphocytes Relative: 23.7 % (ref 12.0–46.0)
Lymphs Abs: 1.5 10*3/uL (ref 0.7–4.0)
MCHC: 33.7 g/dL (ref 30.0–36.0)
MCV: 90.3 fl (ref 78.0–100.0)
Monocytes Absolute: 0.5 10*3/uL (ref 0.1–1.0)
Monocytes Relative: 8.3 % (ref 3.0–12.0)
Neutro Abs: 4.2 10*3/uL (ref 1.4–7.7)
Neutrophils Relative %: 64.1 % (ref 43.0–77.0)
Platelets: 202 10*3/uL (ref 150.0–400.0)
RBC: 4.69 Mil/uL (ref 3.87–5.11)
RDW: 13.6 % (ref 11.5–15.5)
WBC: 6.5 10*3/uL (ref 4.0–10.5)

## 2019-06-28 LAB — HEPATIC FUNCTION PANEL
ALT: 29 U/L (ref 0–35)
AST: 27 U/L (ref 0–37)
Albumin: 4.4 g/dL (ref 3.5–5.2)
Alkaline Phosphatase: 59 U/L (ref 39–117)
Bilirubin, Direct: 0.1 mg/dL (ref 0.0–0.3)
Total Bilirubin: 0.4 mg/dL (ref 0.2–1.2)
Total Protein: 7.5 g/dL (ref 6.0–8.3)

## 2019-06-28 LAB — TSH: TSH: 2.5 u[IU]/mL (ref 0.35–4.50)

## 2019-06-28 LAB — T3, FREE: T3, Free: 3.4 pg/mL (ref 2.3–4.2)

## 2019-06-28 LAB — LIPID PANEL
Cholesterol: 212 mg/dL — ABNORMAL HIGH (ref 0–200)
HDL: 82.7 mg/dL (ref >39.00)
LDL Cholesterol: 114 mg/dL — ABNORMAL HIGH (ref 0–99)
NonHDL: 129.7
Total CHOL/HDL Ratio: 3
Triglycerides: 79 mg/dL (ref 0.0–149.0)
VLDL: 15.8 mg/dL (ref 0.0–40.0)

## 2019-06-28 LAB — T4, FREE: Free T4: 0.86 ng/dL (ref 0.60–1.60)

## 2019-06-28 MED ORDER — ALENDRONATE SODIUM 70 MG PO TABS: 70.0000 mg | ORAL_TABLET | ORAL | 11 refills | Status: DC

## 2019-06-28 MED ORDER — LEVOTHYROXINE SODIUM 25 MCG PO TABS: 25.0000 ug | ORAL_TABLET | Freq: Every day | ORAL | 3 refills | Status: DC

## 2019-06-28 NOTE — Progress Notes (Signed)
   Subjective:    Patient ID: JOLAINE HAMBRIC, female    DOB: 1956-06-11, 63 y.o.   MRN: [id number]  HPI Here to follow up on hypothyroidism and to ask if we can take over prescribing Alendronate. She has been getting this from Dr. Everlene Farrier, her GYN, for about 5 years now, and he has been arranging bone density tests every few years. She feels well in general. She tries to walk every day for exercise since she cannot go to her gym right now.    Review of Systems  Constitutional: Negative.   Respiratory: Negative.   Cardiovascular: Negative.   Endocrine: Negative.        Objective:   Physical Exam Constitutional:      Appearance: Normal appearance.  Cardiovascular:     Rate and Rhythm: Normal rate and regular rhythm.     Pulses: Normal pulses.     Heart sounds: Normal heart sounds.  Musculoskeletal:     Right lower leg: No edema.     Left lower leg: No edema.  Neurological:     Mental Status: She is alert.           Assessment & Plan:  She seems to be doing well. For the hypothyroidism we will refill the Levothyroxine and get labs today. For the osteoporosis we will have her sign a release so we can get records sent over from Dr. Sherlynn Stalls office. If she has been taking this for 5 years or more, I will plan on stopping it.  Alysia Penna, MD

## 2019-07-03 NOTE — Addendum Note (Signed)
Addended by: Alysia Penna A on: 07/03/2019 09:41 AM   Modules accepted: Orders

## 2019-07-18 DIAGNOSIS — M9903 Segmental and somatic dysfunction of lumbar region: Secondary | ICD-10-CM | POA: Diagnosis not present

## 2019-07-18 DIAGNOSIS — M9901 Segmental and somatic dysfunction of cervical region: Secondary | ICD-10-CM | POA: Diagnosis not present

## 2019-07-18 DIAGNOSIS — M542 Cervicalgia: Secondary | ICD-10-CM | POA: Diagnosis not present

## 2019-07-18 DIAGNOSIS — S338XXA Sprain of other parts of lumbar spine and pelvis, initial encounter: Secondary | ICD-10-CM | POA: Diagnosis not present

## 2019-08-08 DIAGNOSIS — M9901 Segmental and somatic dysfunction of cervical region: Secondary | ICD-10-CM | POA: Diagnosis not present

## 2019-08-08 DIAGNOSIS — M9903 Segmental and somatic dysfunction of lumbar region: Secondary | ICD-10-CM | POA: Diagnosis not present

## 2019-08-08 DIAGNOSIS — S338XXA Sprain of other parts of lumbar spine and pelvis, initial encounter: Secondary | ICD-10-CM | POA: Diagnosis not present

## 2019-08-08 DIAGNOSIS — M542 Cervicalgia: Secondary | ICD-10-CM | POA: Diagnosis not present

## 2019-09-05 DIAGNOSIS — M9901 Segmental and somatic dysfunction of cervical region: Secondary | ICD-10-CM | POA: Diagnosis not present

## 2019-09-05 DIAGNOSIS — M542 Cervicalgia: Secondary | ICD-10-CM | POA: Diagnosis not present

## 2019-09-05 DIAGNOSIS — M9903 Segmental and somatic dysfunction of lumbar region: Secondary | ICD-10-CM | POA: Diagnosis not present

## 2019-09-05 DIAGNOSIS — S338XXA Sprain of other parts of lumbar spine and pelvis, initial encounter: Secondary | ICD-10-CM | POA: Diagnosis not present

## 2019-09-06 ENCOUNTER — Ambulatory Visit: Payer: Medicare Other | Admitting: Internal Medicine

## 2019-09-13 ENCOUNTER — Other Ambulatory Visit: Payer: Self-pay

## 2019-09-13 ENCOUNTER — Encounter: Payer: Self-pay | Admitting: Internal Medicine

## 2019-09-13 ENCOUNTER — Ambulatory Visit: Payer: Medicare Other | Admitting: Internal Medicine

## 2019-09-13 LAB — BASIC METABOLIC PANEL
BUN: 20 mg/dL (ref 6–23)
CO2: 29 mEq/L (ref 19–32)
Calcium: 11.9 mg/dL — ABNORMAL HIGH (ref 8.4–10.5)
Chloride: 106 mEq/L (ref 96–112)
Creatinine, Ser: 0.67 mg/dL (ref 0.40–1.20)
GFR: 89.34 mL/min (ref >60.00)
Glucose, Bld: 118 mg/dL — ABNORMAL HIGH (ref 70–99)
Potassium: 3.7 mEq/L (ref 3.5–5.1)
Sodium: 140 mEq/L (ref 135–145)

## 2019-09-13 LAB — ALBUMIN: Albumin: 4.2 g/dL (ref 3.5–5.2)

## 2019-09-13 LAB — VITAMIN D 25 HYDROXY (VIT D DEFICIENCY, FRACTURES): VITD: 39.44 ng/mL (ref 30.00–100.00)
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
## 2019-09-13 NOTE — Patient Instructions (Addendum)
-   Maintain proper hydration  - Stop Tums, do not take any over the counter calcium pills - Please maintain 2-3 servings of calcium in your diet daily     24-Hour Urine Collection   You will be collecting your urine for a 24-hour period of time.  Your timer starts with your first urine of the morning (For example - If you first pee at [place], your timer will start at [place])  [place] away your first urine of the morning  Collect your urine every time you pee for the next 24 hours STOP your urine collection 24 hours after you started the collection (For example - You would stop at 9AM the day after you started)    FOODS THAT CONTAIN CALCIUM  Calcium can be found in many foods, not only in dairy products.  Dairy Foods  Yogurt (1 cup) 350 mg  Milk (1 cup) 300 mg  Cheddar cheese (1 oz.) 204 mg  Ricotta cheese, part skim (1/4 cup) 169 mg  Cottage cheese (1 cup) 150 mg  Nondairy Foods  Whole Grain Total cereal (3/4 cup) 1000 mg  Pink salmon with bones, sardines (3 oz., cooked) 181 mg  Black beans (1 cup) 103 mg  Broccoli (1 cup, cooked) 150 mg  Almonds (1 tbsp.) 50 mg  Soy Products  Soy yogurt with calcium (3/4 cup) 300 mg  Soy milk enriched with calcium (1 cup) 300 mg  Tofu, firm or extra firm (1/4 cup) 250 mg  Soy nuts, roasted/salted (1/2 cup) 103 mg

## 2019-09-14 ENCOUNTER — Encounter: Payer: Self-pay | Admitting: Internal Medicine

## 2019-09-14 ENCOUNTER — Telehealth: Payer: Self-pay | Admitting: Internal Medicine

## 2019-09-14 LAB — PARATHYROID HORMONE, INTACT (NO CA): PTH: 46 pg/mL (ref 14–64)

## 2019-09-14 NOTE — Telephone Encounter (Signed)
Please let her know that her calcium is high but stable. Vitamin D is normal.   She may start 24-hr urine collection.     Thanks   Abby Nena Jordan, MD  [facility]  [facility] [address] [place], [place] [address] Phone: [phone] FAX: [phone]

## 2019-09-14 NOTE — Telephone Encounter (Signed)
Lft vm to return call to discuss labs

## 2019-09-14 NOTE — Telephone Encounter (Signed)
Pt aware of results 

## 2019-09-26 ENCOUNTER — Other Ambulatory Visit: Payer: Self-pay

## 2019-09-26 ENCOUNTER — Other Ambulatory Visit: Payer: Medicare Other

## 2019-09-26 DIAGNOSIS — M542 Cervicalgia: Secondary | ICD-10-CM | POA: Diagnosis not present

## 2019-09-26 DIAGNOSIS — M9903 Segmental and somatic dysfunction of lumbar region: Secondary | ICD-10-CM | POA: Diagnosis not present

## 2019-09-26 DIAGNOSIS — M9901 Segmental and somatic dysfunction of cervical region: Secondary | ICD-10-CM | POA: Diagnosis not present

## 2019-09-26 DIAGNOSIS — S338XXA Sprain of other parts of lumbar spine and pelvis, initial encounter: Secondary | ICD-10-CM | POA: Diagnosis not present

## 2019-09-27 LAB — CALCIUM, URINE, 24 HOUR: Calcium, 24H Urine: 237 mg/24 h

## 2019-09-27 LAB — CREATININE, URINE, 24 HOUR: Creatinine, 24H Ur: 0.78 g/(24.h) (ref 0.50–2.15)

## 2019-09-29 ENCOUNTER — Telehealth: Payer: Self-pay | Admitting: Internal Medicine

## 2019-09-29 NOTE — Telephone Encounter (Signed)
24- hr urine ca/cr ratio 0.017 - inconclusive but most likely primary hyperparathyroidism    Will continue to monitor.    Abby Nena Jordan, MD  [facility] End[facility]in[facility]  [facility] [address] [place], [place] [address] Phone: [phone] FAX: [phone]

## 2019-10-05 ENCOUNTER — Ambulatory Visit: Payer: Medicare Other | Attending: Internal Medicine

## 2019-10-05 DIAGNOSIS — Z23 Encounter for immunization: Secondary | ICD-10-CM

## 2019-10-05 NOTE — Progress Notes (Signed)
   Covid-19 Vaccination Clinic  Name:  Christy Douglas    MRN: [id number] DOB: 12-Aug-1955  10/05/2019  [name] was observed post Covid-19 immunization for 15 minutes without incident. She was provided with Vaccine Information Sheet and instruction to access the V-Safe system.   [name] was instructed to call 911 with any severe reactions post vaccine: Marland Kitchen Difficulty breathing  . Swelling of face and throat  . A fast heartbeat  . A bad rash all over body  . Dizziness and weakness   Immunizations Administered    Name Date Dose VIS Date Route   Pfizer COVID-19 Vaccine 10/05/2019 11:32 AM 0.3 mL 07/14/2019 Intramuscular   Manufacturer: [place]   Lot: [id number]   [place]: [id number]

## 2019-10-17 DIAGNOSIS — M542 Cervicalgia: Secondary | ICD-10-CM | POA: Diagnosis not present

## 2019-10-17 DIAGNOSIS — M9901 Segmental and somatic dysfunction of cervical region: Secondary | ICD-10-CM | POA: Diagnosis not present

## 2019-10-17 DIAGNOSIS — S338XXA Sprain of other parts of lumbar spine and pelvis, initial encounter: Secondary | ICD-10-CM | POA: Diagnosis not present

## 2019-10-17 DIAGNOSIS — M9903 Segmental and somatic dysfunction of lumbar region: Secondary | ICD-10-CM | POA: Diagnosis not present

## 2019-10-30 ENCOUNTER — Ambulatory Visit: Payer: Medicare Other | Attending: Internal Medicine

## 2019-10-30 DIAGNOSIS — Z23 Encounter for immunization: Secondary | ICD-10-CM

## 2019-10-30 NOTE — Progress Notes (Signed)
   Covid-19 Vaccination Clinic  Name:  Christy Douglas    MRN: [id number] DOB: [birth year]  10/30/2019  Christy Douglas was observed post Covid-19 immunization for 15 minutes without incident. She was provided with Vaccine Information Sheet and instruction to access the V-Safe system.   Christy Douglas was instructed to call 911 with any severe reactions post vaccine: Marland Kitchen Difficulty breathing  . Swelling of face and throat  . A fast heartbeat  . A bad rash all over body  . Dizziness and weakness   Immunizations Administered    Name Date Dose VIS Date Route   Pfizer COVID-19 Vaccine 10/30/2019 11:24 AM 0.3 mL 07/14/2019 Intramuscular   Manufacturer: [place]   Lot: [id number]   [place]: [id number]

## 2019-11-01 ENCOUNTER — Ambulatory Visit: Payer: Medicare Other

## 2019-11-06 DIAGNOSIS — M9903 Segmental and somatic dysfunction of lumbar region: Secondary | ICD-10-CM | POA: Diagnosis not present

## 2019-11-06 DIAGNOSIS — S338XXA Sprain of other parts of lumbar spine and pelvis, initial encounter: Secondary | ICD-10-CM | POA: Diagnosis not present

## 2019-11-06 DIAGNOSIS — M542 Cervicalgia: Secondary | ICD-10-CM | POA: Diagnosis not present

## 2019-11-06 DIAGNOSIS — M9901 Segmental and somatic dysfunction of cervical region: Secondary | ICD-10-CM | POA: Diagnosis not present

## 2019-11-21 DIAGNOSIS — M9903 Segmental and somatic dysfunction of lumbar region: Secondary | ICD-10-CM | POA: Diagnosis not present

## 2019-11-21 DIAGNOSIS — S338XXA Sprain of other parts of lumbar spine and pelvis, initial encounter: Secondary | ICD-10-CM | POA: Diagnosis not present

## 2019-11-21 DIAGNOSIS — M542 Cervicalgia: Secondary | ICD-10-CM | POA: Diagnosis not present

## 2019-11-21 DIAGNOSIS — M9901 Segmental and somatic dysfunction of cervical region: Secondary | ICD-10-CM | POA: Diagnosis not present

## 2019-12-12 DIAGNOSIS — M9901 Segmental and somatic dysfunction of cervical region: Secondary | ICD-10-CM | POA: Diagnosis not present

## 2019-12-12 DIAGNOSIS — M542 Cervicalgia: Secondary | ICD-10-CM | POA: Diagnosis not present

## 2019-12-12 DIAGNOSIS — M9903 Segmental and somatic dysfunction of lumbar region: Secondary | ICD-10-CM | POA: Diagnosis not present

## 2019-12-12 DIAGNOSIS — S338XXA Sprain of other parts of lumbar spine and pelvis, initial encounter: Secondary | ICD-10-CM | POA: Diagnosis not present

## 2020-01-02 DIAGNOSIS — M9903 Segmental and somatic dysfunction of lumbar region: Secondary | ICD-10-CM | POA: Diagnosis not present

## 2020-01-02 DIAGNOSIS — M9901 Segmental and somatic dysfunction of cervical region: Secondary | ICD-10-CM | POA: Diagnosis not present

## 2020-01-02 DIAGNOSIS — M542 Cervicalgia: Secondary | ICD-10-CM | POA: Diagnosis not present

## 2020-01-02 DIAGNOSIS — S338XXA Sprain of other parts of lumbar spine and pelvis, initial encounter: Secondary | ICD-10-CM | POA: Diagnosis not present

## 2020-01-05 ENCOUNTER — Encounter: Payer: Self-pay | Admitting: Gastroenterology

## 2020-01-12 ENCOUNTER — Other Ambulatory Visit: Payer: Self-pay

## 2020-01-12 ENCOUNTER — Encounter: Payer: Self-pay | Admitting: Internal Medicine

## 2020-01-12 ENCOUNTER — Telehealth: Payer: Self-pay

## 2020-01-12 ENCOUNTER — Ambulatory Visit: Payer: Medicare Other | Admitting: Internal Medicine

## 2020-01-12 DIAGNOSIS — E210 Primary hyperparathyroidism: Secondary | ICD-10-CM | POA: Insufficient documentation
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
## 2020-01-12 NOTE — Telephone Encounter (Signed)
Patient would like a copy of her lab results mailed to her home address.   I told her that she could see them in my chart and she said that she don't know how to find them.   I told her I would send you a note to let you know.     Thank You, Cyndi

## 2020-01-12 NOTE — Patient Instructions (Signed)
-   Maintain proper hydration  - Avoid over the counter calcium tablets but  maintain 2-3 servings of dairy in your diet ( low fat )  - Please have your gynecologist send [place] your bone density results [facility] Fax [phone])

## 2020-01-13 ENCOUNTER — Encounter: Payer: Self-pay | Admitting: Family Medicine

## 2020-01-15 ENCOUNTER — Encounter: Payer: Self-pay | Admitting: Internal Medicine

## 2020-01-15 LAB — BASIC METABOLIC PANEL
BUN: 23 mg/dL (ref 7–25)
CO2: 27 mmol/L (ref 20–32)
Calcium: 11.5 mg/dL — ABNORMAL HIGH (ref 8.6–10.4)
Chloride: 105 mmol/L (ref 98–110)
Creat: 0.74 mg/dL (ref 0.50–0.99)
Glucose, Bld: 105 mg/dL — ABNORMAL HIGH (ref 65–99)
Potassium: 4.1 mmol/L (ref 3.5–5.3)
Sodium: 139 mmol/L (ref 135–146)

## 2020-01-15 LAB — PARATHYROID HORMONE, INTACT (NO CA): PTH: 55 pg/mL (ref 14–64)

## 2020-01-15 LAB — ALBUMIN: Albumin: 4.5 g/dL (ref 3.6–5.1)

## 2020-01-15 LAB — VITAMIN D 25 HYDROXY (VIT D DEFICIENCY, FRACTURES): Vit D, 25-Hydroxy: 34 ng/mL (ref 30–100)

## 2020-01-15 NOTE — Telephone Encounter (Signed)
Looks like one lab has not been received yet, will wait on those results and then mail all results to pt

## 2020-01-16 NOTE — Telephone Encounter (Signed)
Everything looks OK to me. I would follow up with Dr. Kelton Pillar as she directed

## 2020-01-23 DIAGNOSIS — M542 Cervicalgia: Secondary | ICD-10-CM | POA: Diagnosis not present

## 2020-01-23 DIAGNOSIS — S338XXA Sprain of other parts of lumbar spine and pelvis, initial encounter: Secondary | ICD-10-CM | POA: Diagnosis not present

## 2020-01-23 DIAGNOSIS — M9901 Segmental and somatic dysfunction of cervical region: Secondary | ICD-10-CM | POA: Diagnosis not present

## 2020-01-23 DIAGNOSIS — M9903 Segmental and somatic dysfunction of lumbar region: Secondary | ICD-10-CM | POA: Diagnosis not present

## 2020-02-02 ENCOUNTER — Other Ambulatory Visit: Payer: Self-pay

## 2020-02-02 ENCOUNTER — Ambulatory Visit (INDEPENDENT_AMBULATORY_CARE_PROVIDER_SITE_OTHER): Payer: Medicare Other | Admitting: Family Medicine

## 2020-02-02 ENCOUNTER — Encounter: Payer: Self-pay | Admitting: Family Medicine

## 2020-02-02 VITALS — BP 110/72 | HR 69 | Temp 97.7°F | Ht 63.0 in | Wt 151.0 lb

## 2020-02-02 DIAGNOSIS — T162XXA Foreign body in left ear, initial encounter: Secondary | ICD-10-CM

## 2020-02-02 NOTE — Patient Instructions (Signed)
Get back on the Cortisporin otic solution 4 drops left ear four times daily for one week.
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
## 2020-02-06 DIAGNOSIS — N958 Other specified menopausal and perimenopausal disorders: Secondary | ICD-10-CM | POA: Diagnosis not present

## 2020-02-06 DIAGNOSIS — M816 Localized osteoporosis [Lequesne]: Secondary | ICD-10-CM | POA: Diagnosis not present

## 2020-02-06 DIAGNOSIS — Z1231 Encounter for screening mammogram for malignant neoplasm of breast: Secondary | ICD-10-CM | POA: Diagnosis not present

## 2020-02-08 ENCOUNTER — Ambulatory Visit (AMBULATORY_SURGERY_CENTER): Payer: Self-pay | Admitting: *Deleted

## 2020-02-08 ENCOUNTER — Other Ambulatory Visit: Payer: Self-pay

## 2020-02-08 VITALS — Ht 63.0 in | Wt 155.0 lb

## 2020-02-08 DIAGNOSIS — Z1211 Encounter for screening for malignant neoplasm of colon: Secondary | ICD-10-CM

## 2020-02-08 NOTE — Progress Notes (Signed)
Patient is here in-person for PV. Mother is with the patient in [place]. Patient denies any allergies to eggs or soy. Patient denies any problems with anesthesia/sedation. Patient denies any oxygen use at home. Patient denies taking any diet/weight loss medications or blood thinners. Patient is not being treated for MRSA or C-diff. Patient is aware of our care-partner policy and [id number] safety protocol.  COVID-19 vaccines completed 10/30/2019. emmi sent to mychart-pt aware.

## 2020-02-12 ENCOUNTER — Other Ambulatory Visit: Payer: Self-pay | Admitting: Obstetrics and Gynecology

## 2020-02-12 ENCOUNTER — Encounter: Payer: Self-pay | Admitting: Gastroenterology

## 2020-02-12 DIAGNOSIS — R928 Other abnormal and inconclusive findings on diagnostic imaging of breast: Secondary | ICD-10-CM

## 2020-02-13 DIAGNOSIS — M9901 Segmental and somatic dysfunction of cervical region: Secondary | ICD-10-CM | POA: Diagnosis not present

## 2020-02-13 DIAGNOSIS — M542 Cervicalgia: Secondary | ICD-10-CM | POA: Diagnosis not present

## 2020-02-13 DIAGNOSIS — M9903 Segmental and somatic dysfunction of lumbar region: Secondary | ICD-10-CM | POA: Diagnosis not present

## 2020-02-13 DIAGNOSIS — S338XXA Sprain of other parts of lumbar spine and pelvis, initial encounter: Secondary | ICD-10-CM | POA: Diagnosis not present

## 2020-02-15 ENCOUNTER — Other Ambulatory Visit: Payer: Medicare Other

## 2020-02-22 ENCOUNTER — Encounter: Payer: Self-pay | Admitting: Gastroenterology

## 2020-02-22 ENCOUNTER — Ambulatory Visit (AMBULATORY_SURGERY_CENTER): Payer: Medicare Other | Admitting: Gastroenterology

## 2020-02-22 ENCOUNTER — Other Ambulatory Visit: Payer: Self-pay

## 2020-02-22 VITALS — BP 111/67 | HR 57 | Temp 97.8°F | Resp 10 | Ht 63.0 in | Wt 155.0 lb

## 2020-02-22 DIAGNOSIS — Z1211 Encounter for screening for malignant neoplasm of colon: Secondary | ICD-10-CM | POA: Diagnosis not present

## 2020-02-22 HISTORY — PX: COLONOSCOPY: SHX174

## 2020-02-22 MED ORDER — SODIUM CHLORIDE 0.9 % IV SOLN: 500.0000 mL | Freq: Once | INTRAVENOUS | Status: DC

## 2020-02-22 NOTE — Progress Notes (Signed)
VS by CW  Pt's states no medical or surgical changes since previsit or office visit.  

## 2020-02-22 NOTE — Progress Notes (Signed)
Pt Drowsy. VSS. To PACU, report to RN. No anesthetic complications noted.  

## 2020-02-22 NOTE — Op Note (Signed)
[facility] Patient Name: Christy Douglas Procedure Date: 02/22/2020 11:30 AM MRN: [id number] Endoscopist: Mallie Mussel L. Loletha Carrow , MD Age: 64 Referring MD:  Date of Birth: 1955-08-04 Gender: Female Account #: [id number] Procedure:                Colonoscopy Indications:              Screening for colorectal malignant neoplasm Medicines:                Monitored Anesthesia Care Procedure:                Pre-Anesthesia Assessment:                           - Prior to the procedure, a History and Physical                            was performed, and patient medications and                            allergies were reviewed. The patient's tolerance of                            previous anesthesia was also reviewed. The risks                            and benefits of the procedure and the sedation                            options and risks were discussed with the patient.                            All questions were answered, and informed consent                            was obtained. Prior Anticoagulants: The patient has                            taken no previous anticoagulant or antiplatelet                            agents. ASA Grade Assessment: II - A patient with                            mild systemic disease. After reviewing the risks                            and benefits, the patient was deemed in                            satisfactory condition to undergo the procedure.                           After obtaining informed consent, the colonoscope  was passed under direct vision. Throughout the                            procedure, the patient's blood pressure, pulse, and                            oxygen saturations were monitored continuously. The                            [id number] was introduced through the anus and                            advanced to the the terminal ileum, with                            identification of the appendiceal  orifice and IC                            valve. The colonoscopy was performed without                            difficulty. The patient tolerated the procedure                            well. The quality of the bowel preparation was                            good. The ileocecal valve, appendiceal orifice, and                            rectum were photographed. The bowel preparation                            used was Miralax. Scope In: 11:39:23 AM Scope Out: 11:52:48 AM Scope Withdrawal Time: 0 hours 9 minutes 36 seconds  Total Procedure Duration: 0 hours 13 minutes 25 seconds  Findings:                 The perianal and digital rectal examinations were                            normal.                           The terminal ileum appeared normal.                           The entire examined colon appeared normal on direct                            and retroflexion views. Complications:            No immediate complications. Estimated Blood Loss:     Estimated blood loss: none. Impression:               - The examined portion of the ileum was normal.                           -  The entire examined colon is normal on direct and                            retroflexion views.                           - No specimens collected. Recommendation:           - Patient has a contact number available for                            emergencies. The signs and symptoms of potential                            delayed complications were discussed with the                            patient. Return to normal activities tomorrow.                            Written discharge instructions were provided to the                            patient.                           - Resume previous diet.                           - Continue present medications.                           - Repeat colonoscopy in 10 years for screening                            purposes. Christy Douglas L. Loletha Carrow, MD 02/22/2020 11:56:09  AM This report has been signed electronically.

## 2020-02-22 NOTE — Patient Instructions (Signed)
YOU HAD AN ENDOSCOPIC PROCEDURE TODAY AT THE [facility]:   Refer to the procedure report that was given to you for any specific questions about what was found during the examination.  If the procedure report does not answer your questions, please call your gastroenterologist to clarify.  If you requested that your care partner not be given the details of your procedure findings, then the procedure report has been included in a sealed envelope for you to review at your convenience later.  YOU SHOULD EXPECT: Some feelings of bloating in the abdomen. Passage of more gas than usual.  Walking can help get rid of the air that was put into your GI tract during the procedure and reduce the bloating. If you had a lower endoscopy (such as a colonoscopy or flexible sigmoidoscopy) you may notice spotting of blood in your stool or on the toilet paper. If you underwent a bowel prep for your procedure, you may not have a normal bowel movement for a few days.  Please Note:  You might notice some irritation and congestion in your nose or some drainage.  This is from the oxygen used during your procedure.  There is no need for concern and it should clear up in a day or so.  SYMPTOMS TO REPORT IMMEDIATELY:   Following lower endoscopy (colonoscopy or flexible sigmoidoscopy):  Excessive amounts of blood in the stool  Significant tenderness or worsening of abdominal pains  Swelling of the abdomen that is new, acute  Fever of 100F or higher  For urgent or emergent issues, a gastroenterologist can be reached at any hour by calling [phone]. Do not use MyChart messaging for urgent concerns.    DIET:  We do recommend a small meal at first, but then you may proceed to your regular diet.  Drink plenty of fluids but you should avoid alcoholic beverages for 24 hours.  ACTIVITY:  You should plan to take it easy for the rest of today and you should NOT DRIVE or use heavy machinery until tomorrow (because  of the sedation medicines used during the test).    FOLLOW UP: Our staff will call the number listed on your records 48-72 hours following your procedure to check on you and address any questions or concerns that you may have regarding the information given to you following your procedure. If we do not reach you, we will leave a message.  We will attempt to reach you two times.  During this call, we will ask if you have developed any symptoms of COVID 19. If you develop any symptoms (ie: fever, flu-like symptoms, shortness of breath, cough etc.) before then, please call [phone].  If you test positive for Covid 19 in the 2 weeks post procedure, please call and report this information to us.    If any biopsies were taken you will be contacted by phone or by letter within the next 1-3 weeks.  Please call us at [phone] if you have not heard about the biopsies in 3 weeks.    SIGNATURES/CONFIDENTIALITY: You and/or your care partner have signed paperwork which will be entered into your electronic medical record.  These signatures attest to the fact that that the information above on your After Visit Summary has been reviewed and is understood.  Full responsibility of the confidentiality of this discharge information lies with you and/or your care-partner. 

## 2020-02-23 ENCOUNTER — Other Ambulatory Visit: Payer: Medicare Other

## 2020-02-26 ENCOUNTER — Other Ambulatory Visit: Payer: Self-pay

## 2020-02-26 ENCOUNTER — Other Ambulatory Visit: Payer: Self-pay | Admitting: Obstetrics and Gynecology

## 2020-02-26 ENCOUNTER — Ambulatory Visit
Admission: RE | Admit: 2020-02-26 | Discharge: 2020-02-26 | Disposition: A | Discharge status: Home / Self Care | Payer: Medicare Other | Source: Ambulatory Visit | Attending: Obstetrics and Gynecology | Admitting: Obstetrics and Gynecology

## 2020-02-26 ENCOUNTER — Telehealth: Payer: Self-pay

## 2020-02-26 DIAGNOSIS — N6310 Unspecified lump in the right breast, unspecified quadrant: Secondary | ICD-10-CM

## 2020-02-26 DIAGNOSIS — N6489 Other specified disorders of breast: Secondary | ICD-10-CM | POA: Diagnosis not present

## 2020-02-26 DIAGNOSIS — R928 Other abnormal and inconclusive findings on diagnostic imaging of breast: Secondary | ICD-10-CM

## 2020-02-26 NOTE — Telephone Encounter (Signed)
  Follow up Call-  Call back number 02/22/2020  Post procedure Call Back phone  # [phone]  Permission to leave phone message Yes  Some recent data might be hidden     Patient questions:  Do you have a fever, pain , or abdominal swelling? No. Pain Score  0 *  Have you tolerated food without any problems? Yes.    Have you been able to return to your normal activities? Yes.    Do you have any questions about your discharge instructions: Diet   No. Medications  No. Follow up visit  No.  Do you have questions or concerns about your Care? No.  Actions: * If pain score is 4 or above: No action needed, pain <4.  1. Have you developed a fever since your procedure? No  2.   Have you had an respiratory symptoms (SOB or cough) since your procedure? No  3.   Have you tested positive for COVID 19 since your procedure No  4.   Have you had any family members/close contacts diagnosed with the COVID 19 since your procedure?  No   If yes to any of these questions please route to Joylene John, RN and Erenest Rasher, RN

## 2020-03-01 ENCOUNTER — Telehealth: Payer: Self-pay | Admitting: Internal Medicine

## 2020-03-01 NOTE — Telephone Encounter (Signed)
Received DXA results as below     Date 10/14/2016  AP Spine  -2.6  Left FN -3.2  Right FN -2.3     Abby Nena Jordan, MD  [facility]  [facility] [address] [place], [place] [address] Phone: [phone] FAX: [phone]

## 2020-03-06 ENCOUNTER — Ambulatory Visit
Admission: RE | Admit: 2020-03-06 | Discharge: 2020-03-06 | Disposition: A | Discharge status: Home / Self Care | Payer: Medicare Other | Source: Ambulatory Visit | Attending: Obstetrics and Gynecology | Admitting: Obstetrics and Gynecology

## 2020-03-06 ENCOUNTER — Other Ambulatory Visit: Payer: Self-pay

## 2020-03-06 DIAGNOSIS — N641 Fat necrosis of breast: Secondary | ICD-10-CM | POA: Diagnosis not present

## 2020-03-06 DIAGNOSIS — N6310 Unspecified lump in the right breast, unspecified quadrant: Secondary | ICD-10-CM

## 2020-03-06 DIAGNOSIS — N6314 Unspecified lump in the right breast, lower inner quadrant: Secondary | ICD-10-CM | POA: Diagnosis not present

## 2020-04-21 ENCOUNTER — Other Ambulatory Visit: Payer: Self-pay | Admitting: Family Medicine

## 2020-07-17 ENCOUNTER — Telehealth: Payer: Self-pay | Admitting: Family Medicine

## 2020-07-17 NOTE — Telephone Encounter (Signed)
Patient needs handicap placard form filled out--requesting it to be a Permanent placard due to permanent brace on Right leg from ankle replacement.Christy Douglas

## 2020-07-19 ENCOUNTER — Ambulatory Visit: Payer: Medicare Other | Admitting: Internal Medicine

## 2020-07-19 ENCOUNTER — Other Ambulatory Visit: Payer: Self-pay

## 2020-07-19 ENCOUNTER — Encounter: Payer: Self-pay | Admitting: Internal Medicine

## 2020-07-19 VITALS — BP 122/72 | HR 70 | Ht 63.0 in | Wt 150.5 lb

## 2020-07-19 DIAGNOSIS — E210 Primary hyperparathyroidism: Secondary | ICD-10-CM | POA: Diagnosis not present

## 2020-07-19 LAB — BASIC METABOLIC PANEL
BUN: 24 mg/dL — ABNORMAL HIGH (ref 6–23)
CO2: 28 mEq/L (ref 19–32)
Calcium: 11.7 mg/dL — ABNORMAL HIGH (ref 8.4–10.5)
Chloride: 105 mEq/L (ref 96–112)
Creatinine, Ser: 0.65 mg/dL (ref 0.40–1.20)
GFR: 94.42 mL/min (ref >60.00)
Glucose, Bld: 90 mg/dL (ref 70–99)
Potassium: 4.2 mEq/L (ref 3.5–5.1)
Sodium: 137 mEq/L (ref 135–145)

## 2020-07-19 LAB — VITAMIN D 25 HYDROXY (VIT D DEFICIENCY, FRACTURES): VITD: 48.64 ng/mL (ref 30.00–100.00)

## 2020-07-19 LAB — ALBUMIN: Albumin: 4.5 g/dL (ref 3.5–5.2)
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
## 2020-07-19 NOTE — Patient Instructions (Addendum)
-   Maintain proper hydration  - Avoid over the counter calcium tablets including Multivitamins

## 2020-07-22 LAB — PARATHYROID HORMONE, INTACT (NO CA): PTH: 70 pg/mL — ABNORMAL HIGH (ref 14–64)

## 2020-07-25 ENCOUNTER — Encounter: Payer: Self-pay | Admitting: Internal Medicine

## 2020-07-31 ENCOUNTER — Telehealth: Payer: Self-pay | Admitting: Internal Medicine

## 2020-07-31 NOTE — Telephone Encounter (Signed)
PLease let the pt know that her calcium continues to be high and her parathyroid hormone continues to be high as well.    Pt declined surgical intervention thus  She will need to start on Cinaelcet 30 mg daily     This pill is to reduce her calcium levels so that her kidneys are protected.   Side effects include nausea, but this is why we start her on a small dose  Cincalcet 30 mg DAILY    Please let me if she is ok with this, so I can write a prescription to her pharmacy     Thanks    Abby Raelyn Mora, MD  [facility] Endocrin[facility]  [facility] [address] [place], [place] [address] Phone: [phone] FAX: [phone]

## 2020-07-31 NOTE — Telephone Encounter (Signed)
Called and left a message for a call back.

## 2020-08-01 NOTE — Telephone Encounter (Signed)
Called and left a message for patient to call back to discuss results.

## 2020-08-01 NOTE — Telephone Encounter (Signed)
Patient called back and was advised her Calcium and parathyroid hormone levels are high and Dr Lonzo Cloud recommends she start Cinaelcet 30 MG daily. Patient wants to discuss recommendation with her PCP and will call back if she is agreeable to starting medication. She is ok with sending her first Rx to a pharmacy to pick up but wants all refill to be sent to [facility].

## 2020-08-05 ENCOUNTER — Telehealth: Payer: Self-pay | Admitting: Family Medicine

## 2020-08-05 ENCOUNTER — Telehealth: Payer: Self-pay | Admitting: Internal Medicine

## 2020-08-05 MED ORDER — CINACALCET HCL 30 MG PO TABS: 30.0000 mg | ORAL_TABLET | Freq: Every day | ORAL | 3 refills | Status: DC

## 2020-08-05 NOTE — Telephone Encounter (Signed)
  pt  want to know if this is ok to take cinacalcet (SENSIPAR) 30 MG tablet that was prescribed by  - Dr. Lonzo Cloud, Konrad Dolores, MD

## 2020-08-05 NOTE — Telephone Encounter (Signed)
Patient called to advise that Cinacalcet is cost prohibitive. Is wanting to know if there is something else can be prescribe.  Patient did call [facility] but they did would not give her alternatives  Call back # [phone]

## 2020-08-05 NOTE — Telephone Encounter (Signed)
Done. Cinacalcet sent to [facility].

## 2020-08-05 NOTE — Telephone Encounter (Signed)
Please advise on message below. Thanks. Dm/cma  

## 2020-08-05 NOTE — Telephone Encounter (Signed)
Please advise 

## 2020-08-05 NOTE — Telephone Encounter (Signed)
Christy Douglas, Please reach out to the company for pt assistance.    Thanks

## 2020-08-05 NOTE — Telephone Encounter (Signed)
Tried to call patient but phone kept ringing

## 2020-08-07 NOTE — Telephone Encounter (Signed)
Message left for patient to return my call.  

## 2020-08-08 NOTE — Telephone Encounter (Signed)
Yes of course it is okay to take it

## 2020-08-09 ENCOUNTER — Telehealth: Payer: Self-pay | Admitting: Family Medicine

## 2020-08-09 NOTE — Telephone Encounter (Signed)
Handicap Plate form to be filled out- placed in dr's folder. Call [phone] upon completion.

## 2020-08-09 NOTE — Telephone Encounter (Signed)
The form is ready to be picked up  

## 2020-08-12 NOTE — Telephone Encounter (Signed)
Patient called, no answer, no voicemail set up. MyChart message sent to patient with the below from Dr. Sarajane Jews.

## 2020-08-13 NOTE — Telephone Encounter (Signed)
Left message on machine for patient that form is ready for patient to pick up.

## 2020-08-21 ENCOUNTER — Ambulatory Visit (INDEPENDENT_AMBULATORY_CARE_PROVIDER_SITE_OTHER): Payer: Medicare Other

## 2020-08-21 ENCOUNTER — Other Ambulatory Visit: Payer: Self-pay

## 2020-08-21 DIAGNOSIS — Z0000 Encounter for general adult medical examination without abnormal findings: Secondary | ICD-10-CM | POA: Diagnosis not present
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
## 2020-08-21 NOTE — Patient Instructions (Addendum)
Christy Douglas , Thank you for taking time to come for your Medicare Wellness Visit. I appreciate your ongoing commitment to your health goals. Please review the following plan we discussed and let me know if I can assist you in the future.   Screening recommendations/referrals: Colonoscopy: Done 02/22/20 Mammogram: Pt stated completed in July of 2021 Recommended yearly ophthalmology/optometry visit for glaucoma screening and checkup Recommended yearly dental visit for hygiene and checkup  Vaccinations: Influenza vaccine: Done 05/02/20 Tdap vaccine: Due and disgussed Shingles vaccine: Shingrix discussed. Please contact your pharmacy for coverage information.   Covid-19: Completed 3/4, 3/29, & 06/12/20  Advanced directives: Please bring a copy of your health care power of attorney and living will to the office at your convenience.  Conditions/risks identified: none at this time  Next appointment: Follow up in one year for your annual wellness visit.   Preventive Care 40-64 Years, Female Preventive care refers to lifestyle choices and visits with your health care provider that can promote health and wellness. What does preventive care include?  A yearly physical exam. This is also called an annual well check.  Dental exams once or twice a year.  Routine eye exams. Ask your health care provider how often you should have your eyes checked.  Personal lifestyle choices, including:  Daily care of your teeth and gums.  Regular physical activity.  Eating a healthy diet.  Avoiding tobacco and drug use.  Limiting alcohol use.  Practicing safe sex.  Taking low-dose aspirin daily starting at age 58.  Taking vitamin and mineral supplements as recommended by your health care provider. What happens during an annual well check? The services and screenings done by your health care provider during your annual well check will depend on your age, overall health, lifestyle risk factors, and family  history of disease. Counseling  Your health care provider may ask you questions about your:  Alcohol use.  Tobacco use.  Drug use.  Emotional well-being.  Home and relationship well-being.  Sexual activity.  Eating habits.  Work and work Statistician.  Method of birth control.  Menstrual cycle.  Pregnancy history. Screening  You may have the following tests or measurements:  Height, weight, and BMI.  Blood pressure.  Lipid and cholesterol levels. These may be checked every 5 years, or more frequently if you are over 27 years old.  Skin check.  Lung cancer screening. You may have this screening every year starting at age 42 if you have a 30-pack-year history of smoking and currently smoke or have quit within the past 15 years.  Fecal occult blood test (FOBT) of the stool. You may have this test every year starting at age 62.  Flexible sigmoidoscopy or colonoscopy. You may have a sigmoidoscopy every 5 years or a colonoscopy every 10 years starting at age 69.  Hepatitis C blood test.  Hepatitis B blood test.  Sexually transmitted disease (STD) testing.  Diabetes screening. This is done by checking your blood sugar (glucose) after you have not eaten for a while (fasting). You may have this done every 1-3 years.  Mammogram. This may be done every 1-2 years. Talk to your health care provider about when you should start having regular mammograms. This may depend on whether you have a family history of breast cancer.  BRCA-related cancer screening. This may be done if you have a family history of breast, ovarian, tubal, or peritoneal cancers.  Pelvic exam and Pap test. This may be done every 3 years starting  at age 8. Starting at age 71, this may be done every 5 years if you have a Pap test in combination with an HPV test.  Bone density scan. This is done to screen for osteoporosis. You may have this scan if you are at high risk for osteoporosis. Discuss your test  results, treatment options, and if necessary, the need for more tests with your health care provider. Vaccines  Your health care provider may recommend certain vaccines, such as:  Influenza vaccine. This is recommended every year.  Tetanus, diphtheria, and acellular pertussis (Tdap, Td) vaccine. You may need a Td booster every 10 years.  Zoster vaccine. You may need this after age 89.  Pneumococcal 13-valent conjugate (PCV13) vaccine. You may need this if you have certain conditions and were not previously vaccinated.  Pneumococcal polysaccharide (PPSV23) vaccine. You may need one or two doses if you smoke cigarettes or if you have certain conditions. Talk to your health care provider about which screenings and vaccines you need and how often you need them. This information is not intended to replace advice given to you by your health care provider. Make sure you discuss any questions you have with your health care provider. Document Released: 08/16/2015 Document Revised: 04/08/2016 Document Reviewed: 05/21/2015 Elsevier Interactive Patient Education  2017 [employer] Prevention in the Home Falls can cause injuries. They can happen to people of all ages. There are many things you can do to make your home safe and to help prevent falls. What can I do on the outside of my home?  Regularly fix the edges of walkways and driveways and fix any cracks.  Remove anything that might make you trip as you walk through a door, such as a raised step or threshold.  Trim any bushes or trees on the path to your home.  Use bright outdoor lighting.  Clear any walking paths of anything that might make someone trip, such as rocks or tools.  Regularly check to see if handrails are loose or broken. Make sure that both sides of any steps have handrails.  Any raised decks and porches should have guardrails on the edges.  Have any leaves, snow, or ice cleared regularly.  Use sand or salt on  walking paths during winter.  Clean up any spills in your garage right away. This includes oil or grease spills. What can I do in the bathroom?  Use night lights.  Install grab bars by the toilet and in the tub and shower. Do not use towel bars as grab bars.  Use non-skid mats or decals in the tub or shower.  If you need to sit down in the shower, use a plastic, non-slip stool.  Keep the floor dry. Clean up any water that spills on the floor as soon as it happens.  Remove soap buildup in the tub or shower regularly.  Attach bath mats securely with double-sided non-slip rug tape.  Do not have throw rugs and other things on the floor that can make you trip. What can I do in the bedroom?  Use night lights.  Make sure that you have a light by your bed that is easy to reach.  Do not use any sheets or blankets that are too big for your bed. They should not hang down onto the floor.  Have a firm chair that has side arms. You can use this for support while you get dressed.  Do not have throw rugs and other  things on the floor that can make you trip. What can I do in the kitchen?  Clean up any spills right away.  Avoid walking on wet floors.  Keep items that you use a lot in easy-to-reach places.  If you need to reach something above you, use a strong step stool that has a grab bar.  Keep electrical cords out of the way.  Do not use floor polish or wax that makes floors slippery. If you must use wax, use non-skid floor wax.  Do not have throw rugs and other things on the floor that can make you trip. What can I do with my stairs?  Do not leave any items on the stairs.  Make sure that there are handrails on both sides of the stairs and use them. Fix handrails that are broken or loose. Make sure that handrails are as long as the stairways.  Check any carpeting to make sure that it is firmly attached to the stairs. Fix any carpet that is loose or worn.  Avoid having throw  rugs at the top or bottom of the stairs. If you do have throw rugs, attach them to the floor with carpet tape.  Make sure that you have a light switch at the top of the stairs and the bottom of the stairs. If you do not have them, ask someone to add them for you. What else can I do to help prevent falls?  Wear shoes that:  Do not have high heels.  Have rubber bottoms.  Are comfortable and fit you well.  Are closed at the toe. Do not wear sandals.  If you use a stepladder:  Make sure that it is fully opened. Do not climb a closed stepladder.  Make sure that both sides of the stepladder are locked into place.  Ask someone to hold it for you, if possible.  Clearly mark and make sure that you can see:  Any grab bars or handrails.  First and last steps.  Where the edge of each step is.  Use tools that help you move around (mobility aids) if they are needed. These include:  Canes.  Walkers.  Scooters.  Crutches.  Turn on the lights when you go into a dark area. Replace any light bulbs as soon as they burn out.  Set up your furniture so you have a clear path. Avoid moving your furniture around.  If any of your floors are uneven, fix them.  If there are any pets around you, be aware of where they are.  Review your medicines with your doctor. Some medicines can make you feel dizzy. This can increase your chance of falling. Ask your doctor what other things that you can do to help prevent falls. This information is not intended to replace advice given to you by your health care provider. Make sure you discuss any questions you have with your health care provider. Document Released: 05/16/2009 Document Revised: 12/26/2015 Document Reviewed: 08/24/2014 Elsevier Interactive Patient Education  2017 [employer].

## 2020-08-29 DIAGNOSIS — M542 Cervicalgia: Secondary | ICD-10-CM | POA: Diagnosis not present

## 2020-08-29 DIAGNOSIS — S338XXA Sprain of other parts of lumbar spine and pelvis, initial encounter: Secondary | ICD-10-CM | POA: Diagnosis not present

## 2020-08-29 DIAGNOSIS — M9901 Segmental and somatic dysfunction of cervical region: Secondary | ICD-10-CM | POA: Diagnosis not present

## 2020-08-29 DIAGNOSIS — M9903 Segmental and somatic dysfunction of lumbar region: Secondary | ICD-10-CM | POA: Diagnosis not present

## 2020-09-17 DIAGNOSIS — M5136 Other intervertebral disc degeneration, lumbar region: Secondary | ICD-10-CM | POA: Diagnosis not present

## 2020-09-17 DIAGNOSIS — M5134 Other intervertebral disc degeneration, thoracic region: Secondary | ICD-10-CM | POA: Diagnosis not present

## 2020-09-17 DIAGNOSIS — M9903 Segmental and somatic dysfunction of lumbar region: Secondary | ICD-10-CM | POA: Diagnosis not present

## 2020-09-17 DIAGNOSIS — M9902 Segmental and somatic dysfunction of thoracic region: Secondary | ICD-10-CM | POA: Diagnosis not present

## 2020-09-24 DIAGNOSIS — M9903 Segmental and somatic dysfunction of lumbar region: Secondary | ICD-10-CM | POA: Diagnosis not present

## 2020-09-24 DIAGNOSIS — M5136 Other intervertebral disc degeneration, lumbar region: Secondary | ICD-10-CM | POA: Diagnosis not present

## 2020-09-24 DIAGNOSIS — M5134 Other intervertebral disc degeneration, thoracic region: Secondary | ICD-10-CM | POA: Diagnosis not present

## 2020-09-24 DIAGNOSIS — M9902 Segmental and somatic dysfunction of thoracic region: Secondary | ICD-10-CM | POA: Diagnosis not present

## 2020-10-01 DIAGNOSIS — M5136 Other intervertebral disc degeneration, lumbar region: Secondary | ICD-10-CM | POA: Diagnosis not present

## 2020-10-01 DIAGNOSIS — M9903 Segmental and somatic dysfunction of lumbar region: Secondary | ICD-10-CM | POA: Diagnosis not present

## 2020-10-01 DIAGNOSIS — M5134 Other intervertebral disc degeneration, thoracic region: Secondary | ICD-10-CM | POA: Diagnosis not present

## 2020-10-01 DIAGNOSIS — M9902 Segmental and somatic dysfunction of thoracic region: Secondary | ICD-10-CM | POA: Diagnosis not present

## 2020-10-15 DIAGNOSIS — M9903 Segmental and somatic dysfunction of lumbar region: Secondary | ICD-10-CM | POA: Diagnosis not present

## 2020-10-15 DIAGNOSIS — M5136 Other intervertebral disc degeneration, lumbar region: Secondary | ICD-10-CM | POA: Diagnosis not present

## 2020-10-15 DIAGNOSIS — M5134 Other intervertebral disc degeneration, thoracic region: Secondary | ICD-10-CM | POA: Diagnosis not present

## 2020-10-15 DIAGNOSIS — M9902 Segmental and somatic dysfunction of thoracic region: Secondary | ICD-10-CM | POA: Diagnosis not present

## 2020-10-29 DIAGNOSIS — M9902 Segmental and somatic dysfunction of thoracic region: Secondary | ICD-10-CM | POA: Diagnosis not present

## 2020-10-29 DIAGNOSIS — M5134 Other intervertebral disc degeneration, thoracic region: Secondary | ICD-10-CM | POA: Diagnosis not present

## 2020-10-29 DIAGNOSIS — M5136 Other intervertebral disc degeneration, lumbar region: Secondary | ICD-10-CM | POA: Diagnosis not present

## 2020-10-29 DIAGNOSIS — M9903 Segmental and somatic dysfunction of lumbar region: Secondary | ICD-10-CM | POA: Diagnosis not present

## 2020-11-05 DIAGNOSIS — M9902 Segmental and somatic dysfunction of thoracic region: Secondary | ICD-10-CM | POA: Diagnosis not present

## 2020-11-05 DIAGNOSIS — M5134 Other intervertebral disc degeneration, thoracic region: Secondary | ICD-10-CM | POA: Diagnosis not present

## 2020-11-05 DIAGNOSIS — M9903 Segmental and somatic dysfunction of lumbar region: Secondary | ICD-10-CM | POA: Diagnosis not present

## 2020-11-05 DIAGNOSIS — M5136 Other intervertebral disc degeneration, lumbar region: Secondary | ICD-10-CM | POA: Diagnosis not present

## 2020-11-12 DIAGNOSIS — M5136 Other intervertebral disc degeneration, lumbar region: Secondary | ICD-10-CM | POA: Diagnosis not present

## 2020-11-12 DIAGNOSIS — M5134 Other intervertebral disc degeneration, thoracic region: Secondary | ICD-10-CM | POA: Diagnosis not present

## 2020-11-12 DIAGNOSIS — M9902 Segmental and somatic dysfunction of thoracic region: Secondary | ICD-10-CM | POA: Diagnosis not present

## 2020-11-12 DIAGNOSIS — M9903 Segmental and somatic dysfunction of lumbar region: Secondary | ICD-10-CM | POA: Diagnosis not present

## 2020-11-13 ENCOUNTER — Other Ambulatory Visit: Payer: Self-pay | Admitting: Family Medicine

## 2020-11-25 DIAGNOSIS — M5134 Other intervertebral disc degeneration, thoracic region: Secondary | ICD-10-CM | POA: Diagnosis not present

## 2020-11-25 DIAGNOSIS — M5136 Other intervertebral disc degeneration, lumbar region: Secondary | ICD-10-CM | POA: Diagnosis not present

## 2020-11-25 DIAGNOSIS — M9902 Segmental and somatic dysfunction of thoracic region: Secondary | ICD-10-CM | POA: Diagnosis not present

## 2020-11-25 DIAGNOSIS — M9903 Segmental and somatic dysfunction of lumbar region: Secondary | ICD-10-CM | POA: Diagnosis not present

## 2020-11-25 IMAGING — US US  BREAST BX W/ LOC DEV 1ST LESION IMG BX SPEC US GUIDE*R*
1 series · 10 of 10 positions shown · non-contrast
Comparison: Previous exam(s).
COMPARISON: Previous exam(s).

Addendum:
CLINICAL DATA: Patient presents for ultrasound-guided core needle
biopsy of a 5:30 o'clock, retroareolar right breast mass.

EXAM:
ULTRASOUND GUIDED RIGHT BREAST CORE NEEDLE BIOPSY

[Series 1: us breast bx w/ loc dev 1st lesion img bx spec us  · 0.07mm/px · 10 of 10 slices shown]
[im 1/10]
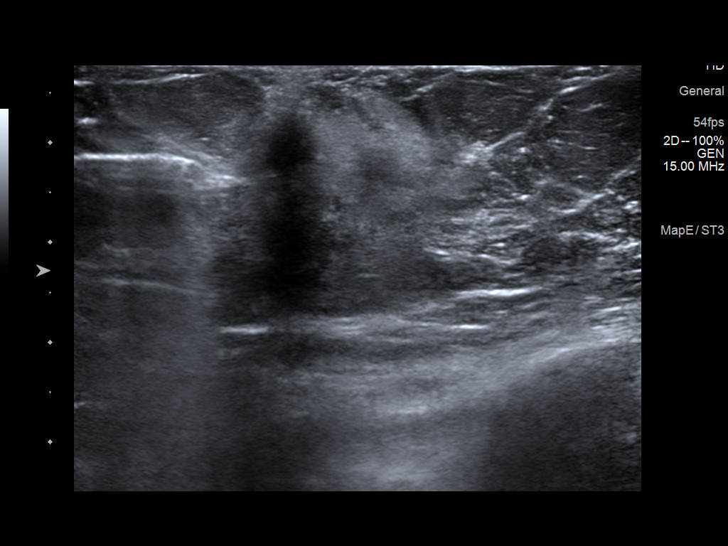
[im 2/10]
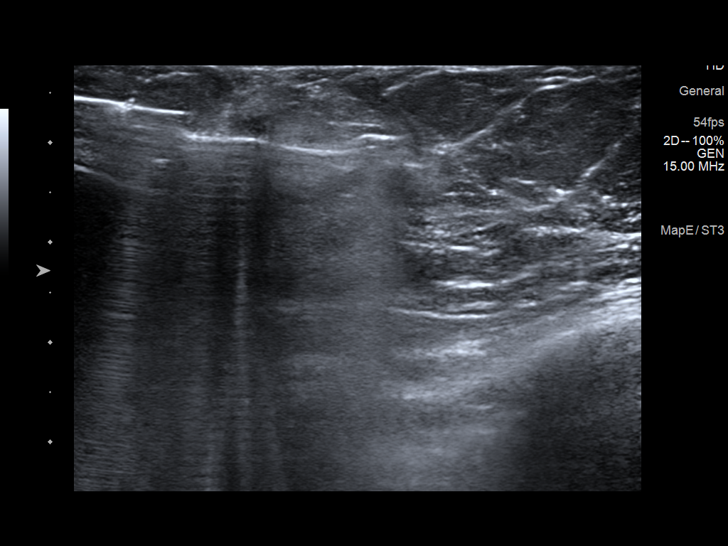
[im 3/10]
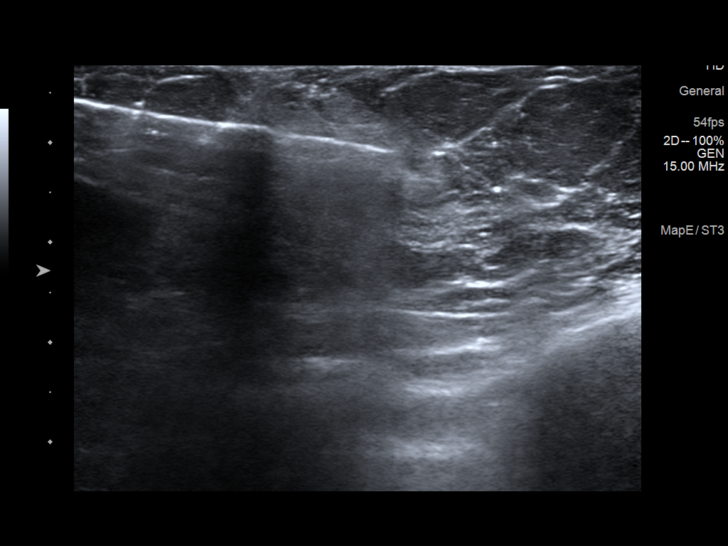
[im 4/10]
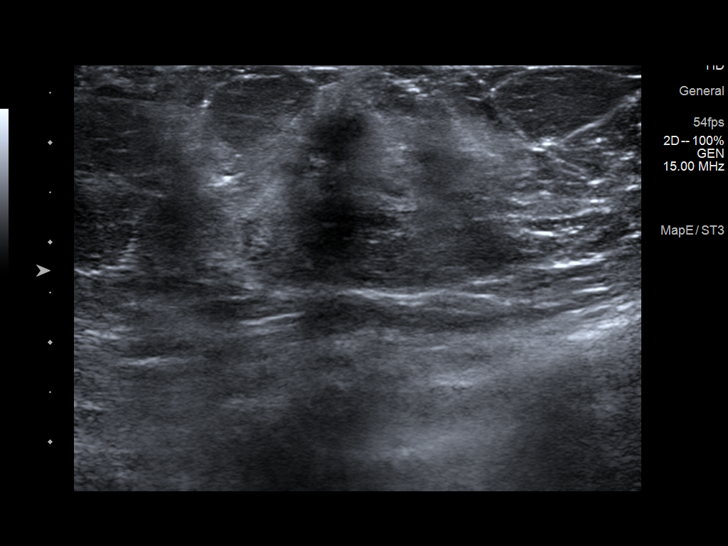
[im 5/10]
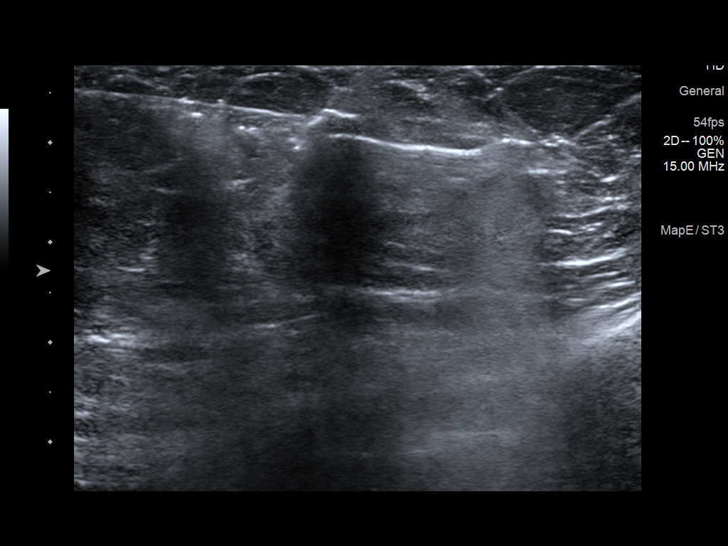
[im 6/10]
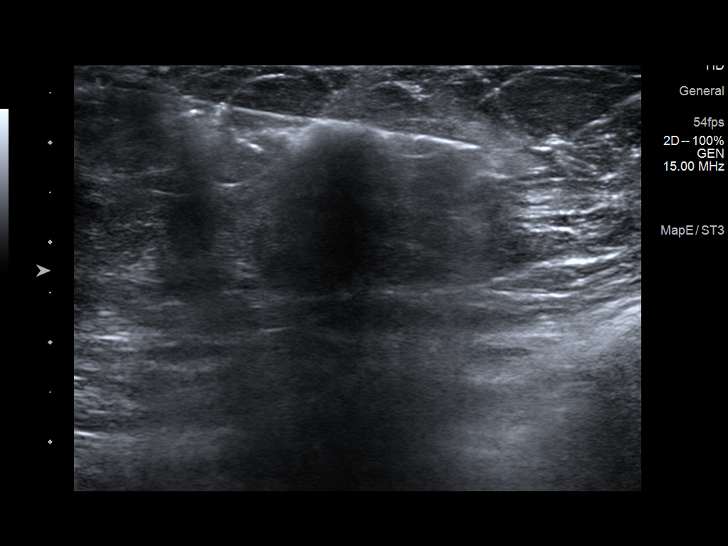
[im 7/10]
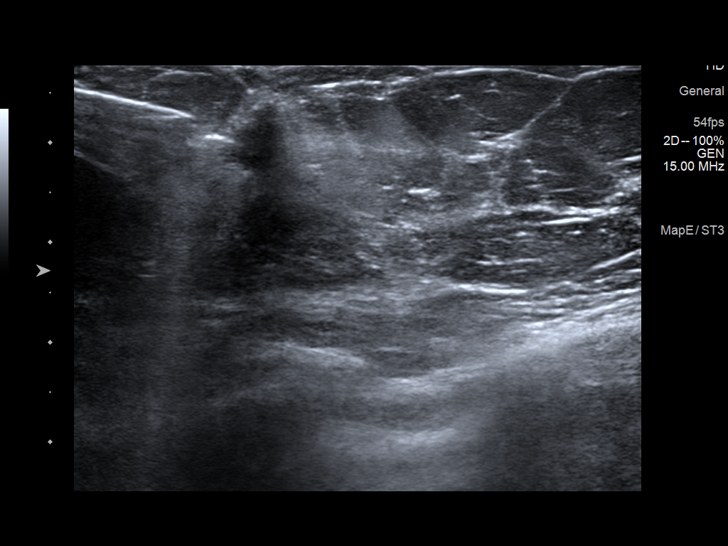
[im 8/10]
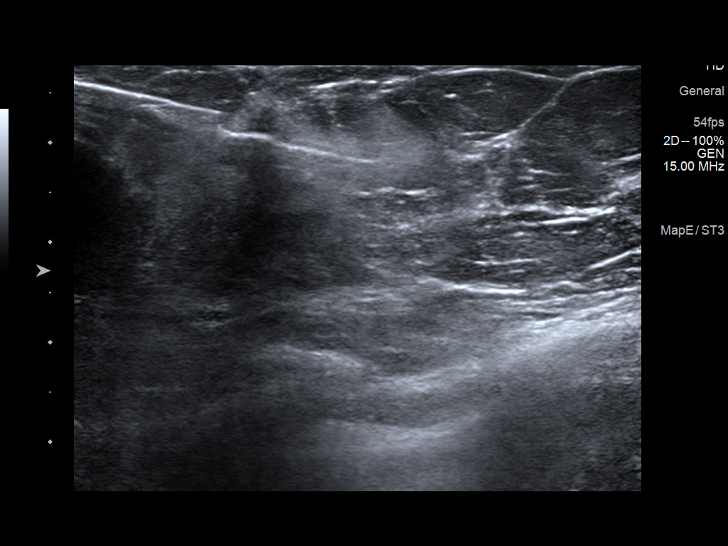
[im 9/10]
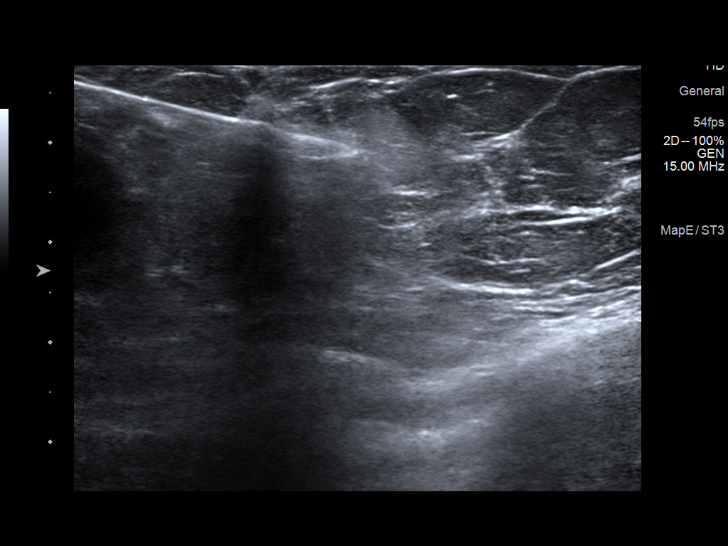
[im 10/10]
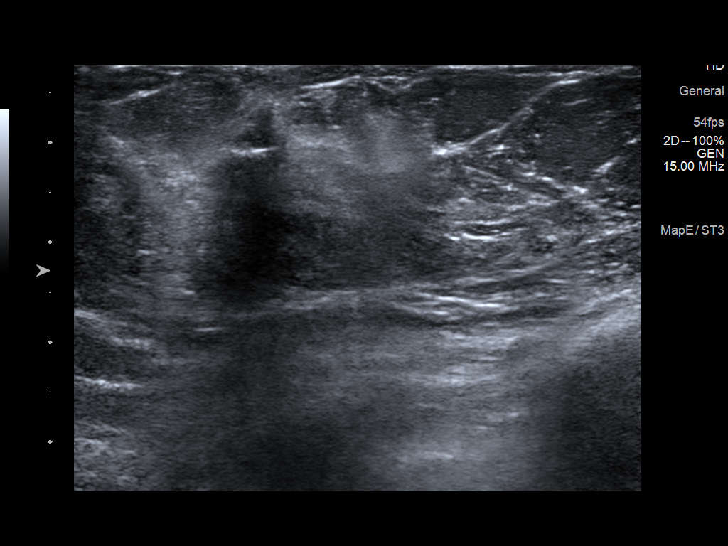

[10 of 10 positions shown; findings below may reference images not displayed]



Lesion quadrant: Lower inner quadrant

Using sterile technique and 1% Lidocaine as local anesthetic, under
direct ultrasound visualization, a 12 gauge Kholifah device was
used to perform biopsy of 5:30 o'clock position, retroareolar right
breast mass using an inferolateral approach. At the conclusion of
the procedure ribbon shaped tissue marker clip was deployed into the
biopsy cavity. Follow up 2 view mammogram was performed and dictated
separately.
IMPRESSION: Ultrasound guided biopsy of a right breast mass. No apparent
complications.

ADDENDUM:
Pathology revealed FAT NECROSIS of the Right breast, 5:30 o'clock,
retroareolar. This was found to be concordant by Dr. Laimita Grublys.

Pathology results were discussed with the patient by telephone. The
patient reported doing well after the biopsy with tenderness at the
site. Post biopsy instructions and care were reviewed and questions
were answered. The patient was encouraged to call The [REDACTED]

The patient was instructed to return for annual screening

Pathology results reported by Mazeed Hussain Pathickal, RN on 03/07/2020.



Lesion quadrant: Lower inner quadrant

Using sterile technique and 1% Lidocaine as local anesthetic, under
direct ultrasound visualization, a 12 gauge Kholifah device was
used to perform biopsy of 5:30 o'clock position, retroareolar right
breast mass using an inferolateral approach. At the conclusion of
the procedure ribbon shaped tissue marker clip was deployed into the
biopsy cavity. Follow up 2 view mammogram was performed and dictated
separately.
IMPRESSION: Ultrasound guided biopsy of a right breast mass. No apparent
complications.

## 2020-11-25 IMAGING — MG MM BREAST LOCALIZATION CLIP
4 series · 4 of 12 positions shown · non-contrast
Comparison: Previous exam(s).

CLINICAL DATA: Evaluate post biopsy marker clip placement following
ultrasound-guided core needle biopsy of a right breast mass.

EXAM:
DIAGNOSTIC RIGHT MAMMOGRAM POST ULTRASOUND BIOPSY

[R ML synth-2D]
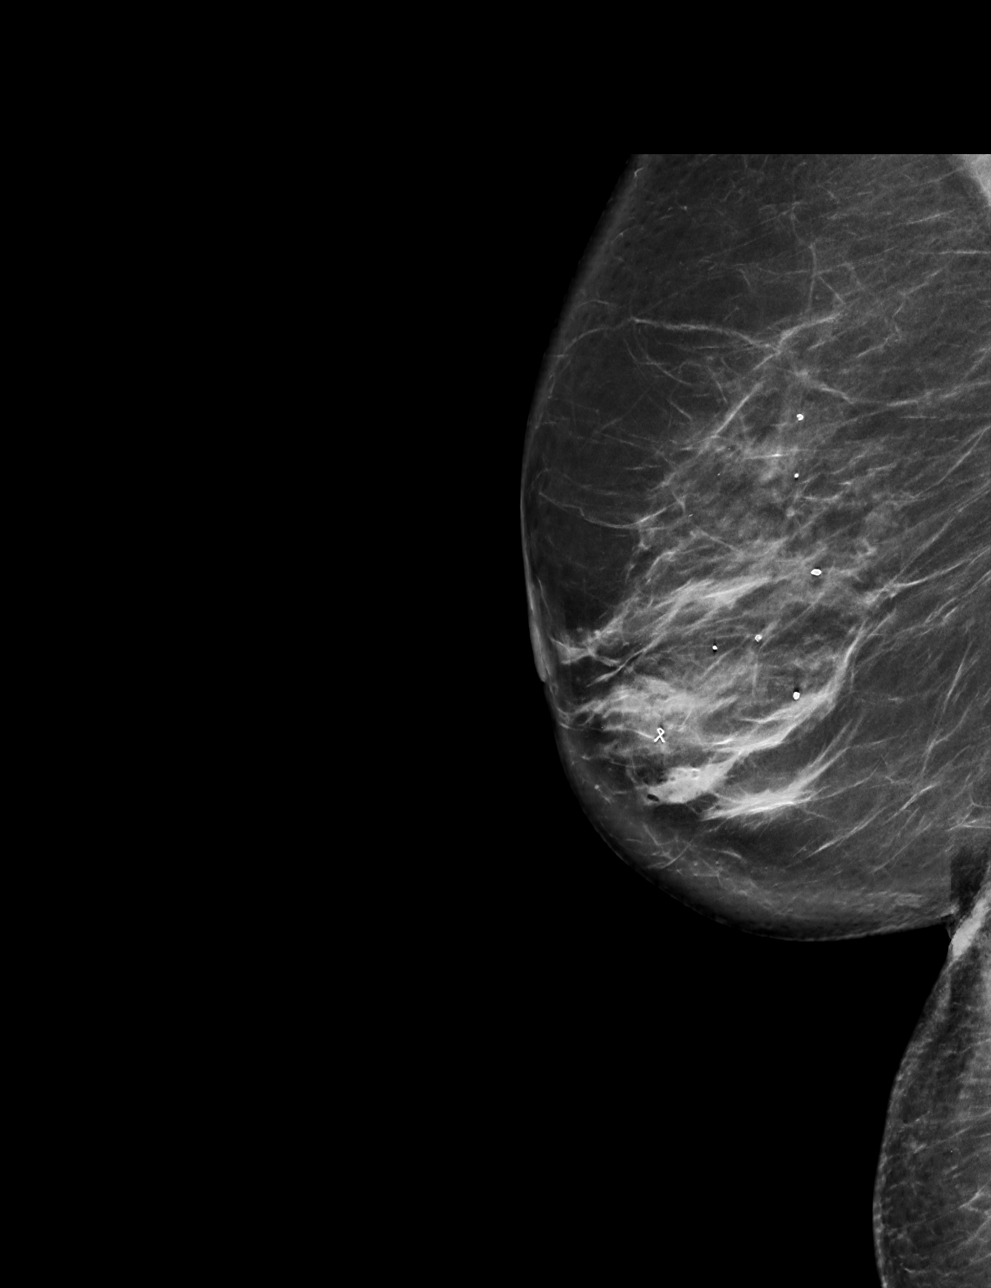

[R CC synth-2D]
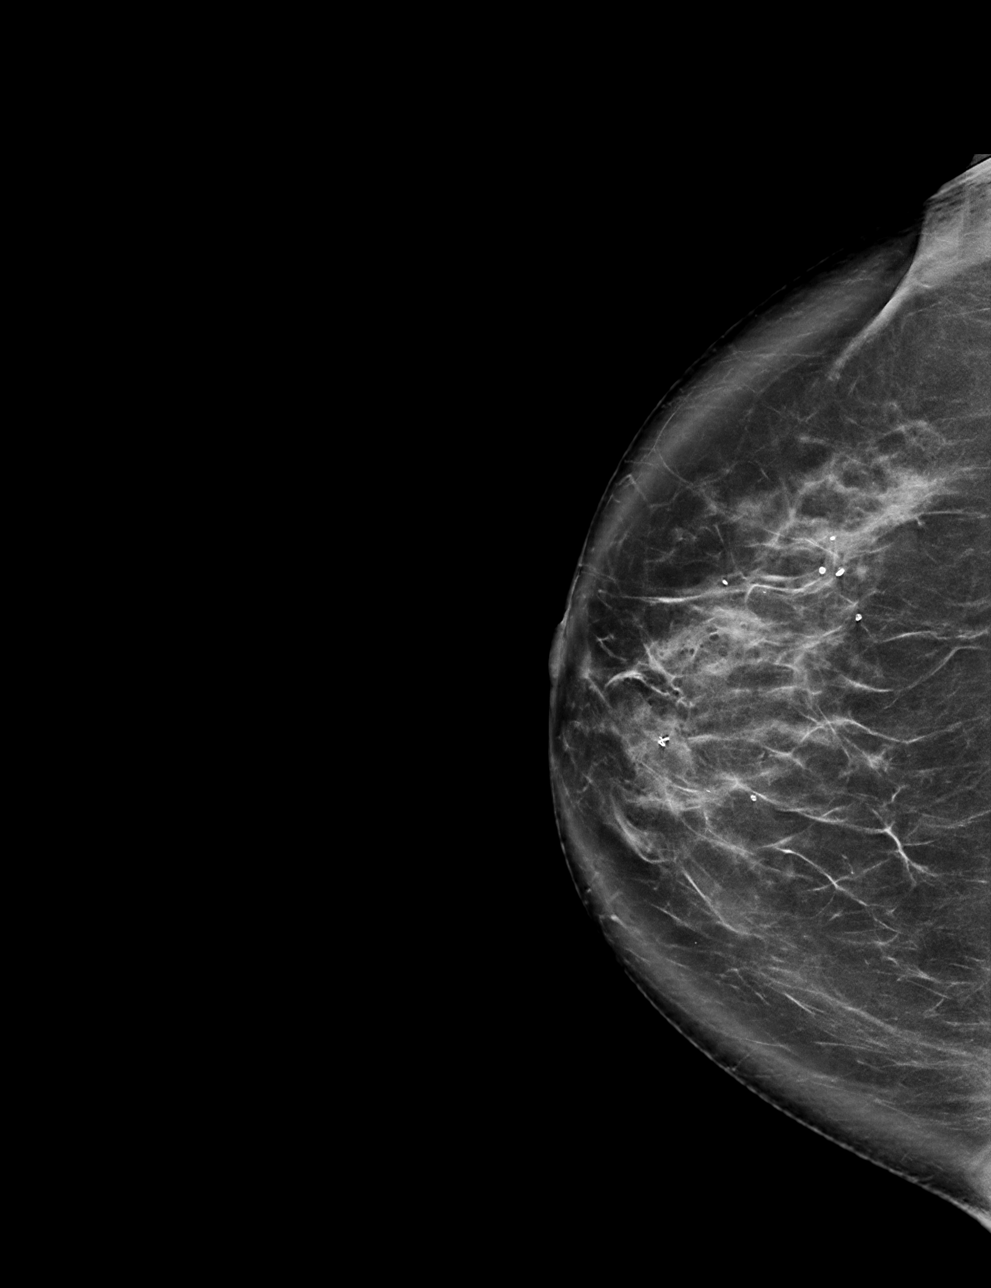

[R ML tomo · tomo slice 45/90.0]
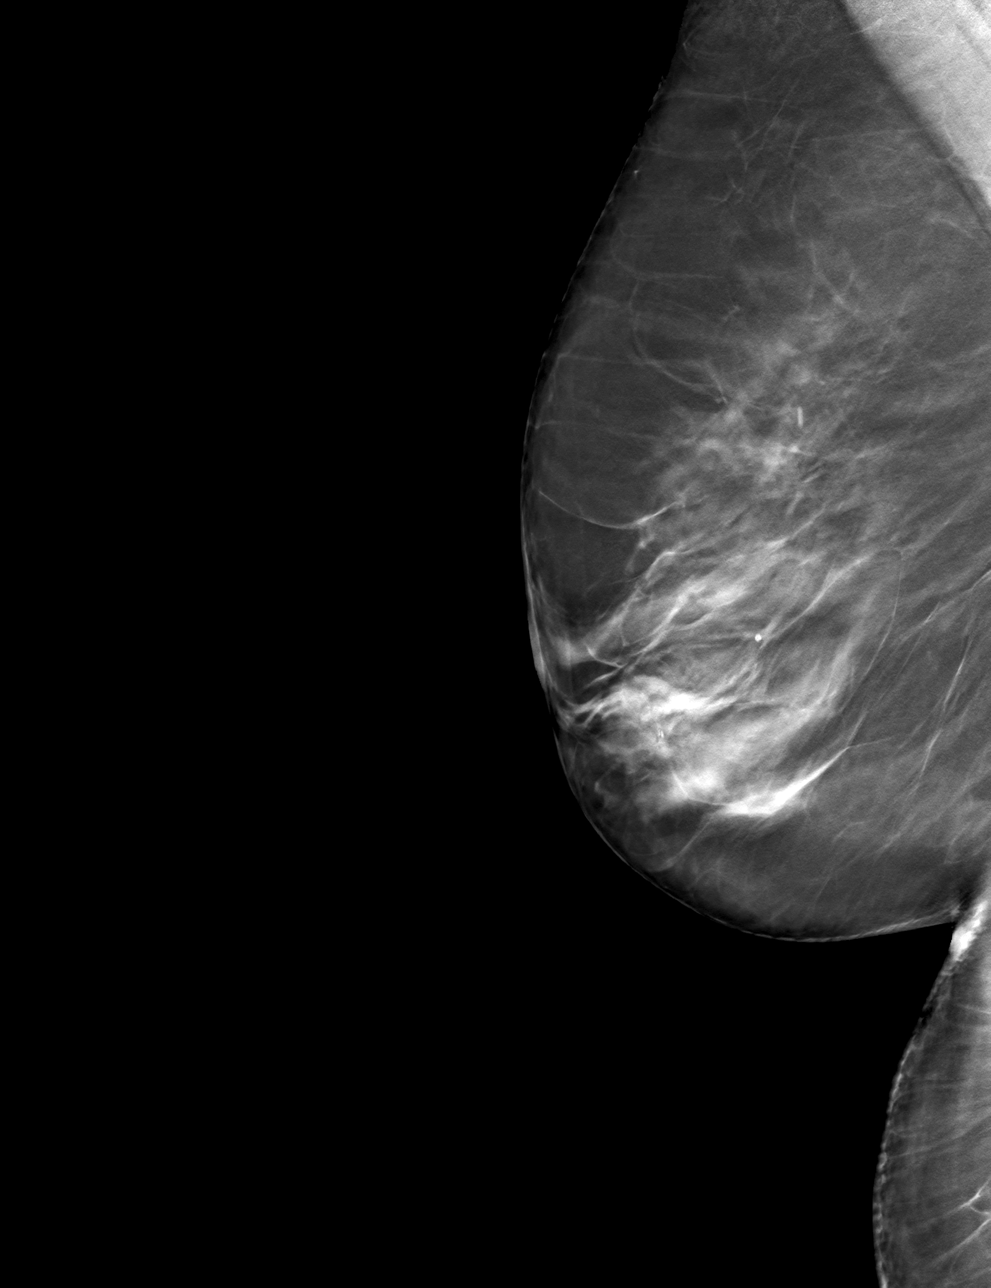

[R CC tomo · tomo slice 47/92.0]
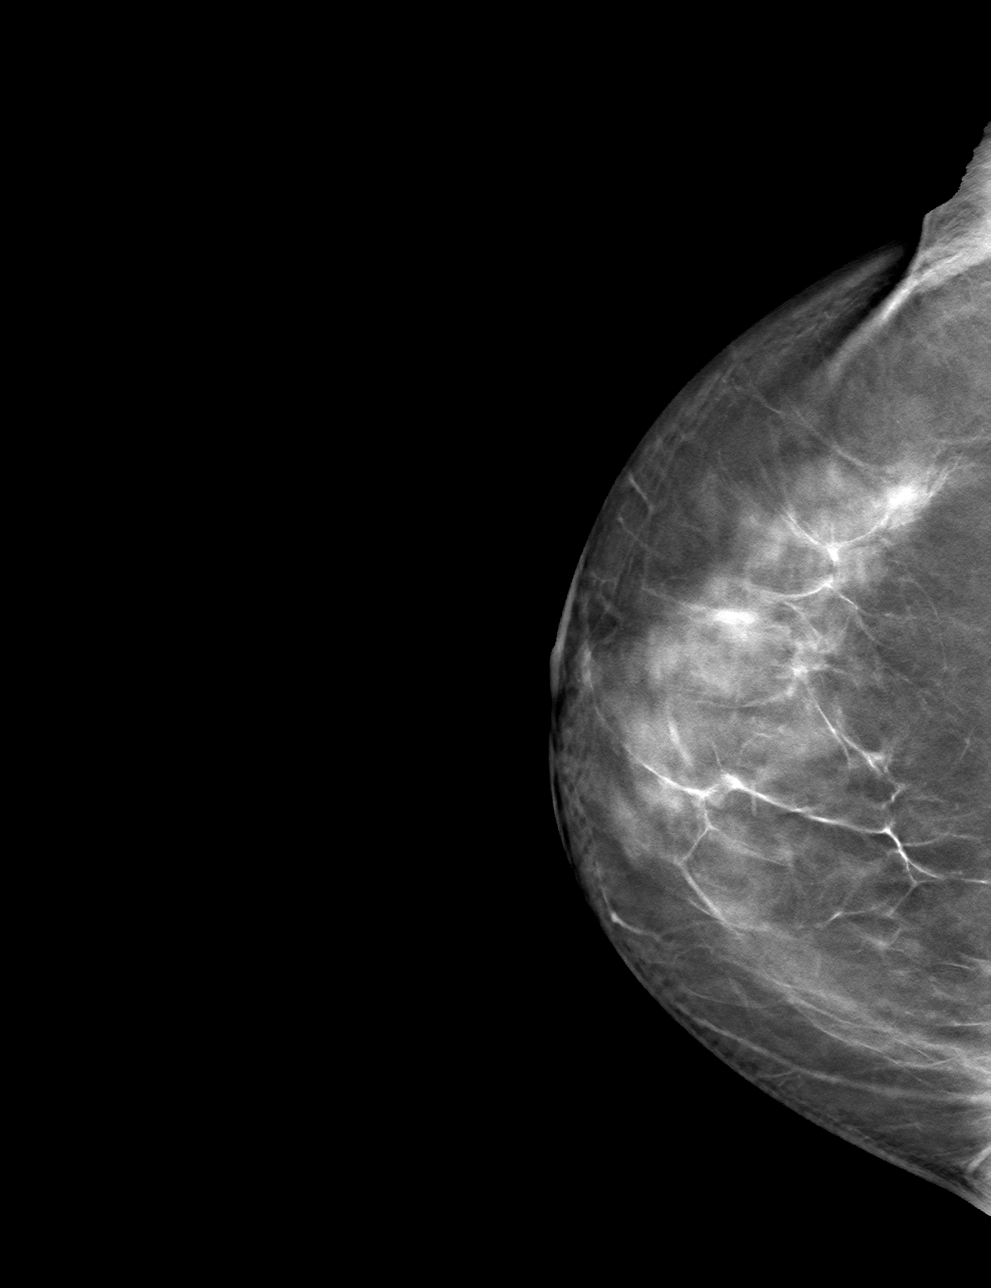

[4 of 12 positions shown; findings below may reference images not displayed]

FINDINGS: Mammographic images were obtained following ultrasound guided biopsy
of a right breast mass. The biopsy marking clip is in expected
position at the site of biopsy.
IMPRESSION: Appropriate positioning of the ribbon shaped biopsy marking clip at
the site of biopsy in the within the mass in the retroareolar,
slightly lower inner quadrant, of the right breast.

Final Assessment: Post Procedure Mammograms for Marker Placement

## 2020-12-04 DIAGNOSIS — H524 Presbyopia: Secondary | ICD-10-CM | POA: Diagnosis not present

## 2020-12-04 DIAGNOSIS — H2513 Age-related nuclear cataract, bilateral: Secondary | ICD-10-CM | POA: Diagnosis not present

## 2020-12-04 DIAGNOSIS — H4720 Unspecified optic atrophy: Secondary | ICD-10-CM | POA: Diagnosis not present

## 2020-12-04 DIAGNOSIS — H25013 Cortical age-related cataract, bilateral: Secondary | ICD-10-CM | POA: Diagnosis not present

## 2020-12-10 DIAGNOSIS — M5136 Other intervertebral disc degeneration, lumbar region: Secondary | ICD-10-CM | POA: Diagnosis not present

## 2020-12-10 DIAGNOSIS — M9902 Segmental and somatic dysfunction of thoracic region: Secondary | ICD-10-CM | POA: Diagnosis not present

## 2020-12-10 DIAGNOSIS — M9903 Segmental and somatic dysfunction of lumbar region: Secondary | ICD-10-CM | POA: Diagnosis not present

## 2020-12-10 DIAGNOSIS — M5134 Other intervertebral disc degeneration, thoracic region: Secondary | ICD-10-CM | POA: Diagnosis not present

## 2020-12-25 DIAGNOSIS — M5134 Other intervertebral disc degeneration, thoracic region: Secondary | ICD-10-CM | POA: Diagnosis not present

## 2020-12-25 DIAGNOSIS — M5136 Other intervertebral disc degeneration, lumbar region: Secondary | ICD-10-CM | POA: Diagnosis not present

## 2020-12-25 DIAGNOSIS — M9903 Segmental and somatic dysfunction of lumbar region: Secondary | ICD-10-CM | POA: Diagnosis not present

## 2020-12-25 DIAGNOSIS — M9902 Segmental and somatic dysfunction of thoracic region: Secondary | ICD-10-CM | POA: Diagnosis not present

## 2021-01-07 DIAGNOSIS — M5136 Other intervertebral disc degeneration, lumbar region: Secondary | ICD-10-CM | POA: Diagnosis not present

## 2021-01-07 DIAGNOSIS — M9903 Segmental and somatic dysfunction of lumbar region: Secondary | ICD-10-CM | POA: Diagnosis not present

## 2021-01-07 DIAGNOSIS — M5134 Other intervertebral disc degeneration, thoracic region: Secondary | ICD-10-CM | POA: Diagnosis not present

## 2021-01-07 DIAGNOSIS — M9902 Segmental and somatic dysfunction of thoracic region: Secondary | ICD-10-CM | POA: Diagnosis not present

## 2021-01-21 DIAGNOSIS — M5134 Other intervertebral disc degeneration, thoracic region: Secondary | ICD-10-CM | POA: Diagnosis not present

## 2021-01-21 DIAGNOSIS — M9902 Segmental and somatic dysfunction of thoracic region: Secondary | ICD-10-CM | POA: Diagnosis not present

## 2021-01-21 DIAGNOSIS — M9903 Segmental and somatic dysfunction of lumbar region: Secondary | ICD-10-CM | POA: Diagnosis not present

## 2021-01-21 DIAGNOSIS — M5136 Other intervertebral disc degeneration, lumbar region: Secondary | ICD-10-CM | POA: Diagnosis not present

## 2021-01-24 ENCOUNTER — Ambulatory Visit (INDEPENDENT_AMBULATORY_CARE_PROVIDER_SITE_OTHER): Payer: Medicare Other | Admitting: Internal Medicine

## 2021-01-24 ENCOUNTER — Other Ambulatory Visit: Payer: Self-pay

## 2021-01-24 VITALS — BP 110/60 | HR 74 | Wt 153.0 lb

## 2021-01-24 DIAGNOSIS — E210 Primary hyperparathyroidism: Secondary | ICD-10-CM | POA: Diagnosis not present

## 2021-01-24 LAB — BASIC METABOLIC PANEL
BUN: 18 mg/dL (ref 6–23)
CO2: 29 mEq/L (ref 19–32)
Calcium: 11.2 mg/dL — ABNORMAL HIGH (ref 8.4–10.5)
Chloride: 104 mEq/L (ref 96–112)
Creatinine, Ser: 0.65 mg/dL (ref 0.40–1.20)
GFR: 94.08 mL/min (ref >60.00)
Glucose, Bld: 141 mg/dL — ABNORMAL HIGH (ref 70–99)
Potassium: 4.2 mEq/L (ref 3.5–5.1)
Sodium: 139 mEq/L (ref 135–145)

## 2021-01-24 LAB — VITAMIN D 25 HYDROXY (VIT D DEFICIENCY, FRACTURES): VITD: 41.9 ng/mL (ref 30.00–100.00)

## 2021-01-24 LAB — ALBUMIN: Albumin: 4.4 g/dL (ref 3.5–5.2)

## 2021-01-24 NOTE — Patient Instructions (Signed)
-   Maintain proper hydration  - Avoid over the counter calcium tablets but  maintain 2-3 servings of dairy in your diet ( low fat ) - Continue Vitamin D 2000 iu daily
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
## 2021-01-25 ENCOUNTER — Encounter: Payer: Self-pay | Admitting: Internal Medicine

## 2021-01-27 LAB — CALCIUM, IONIZED: Calcium, Ion: 6.12 mg/dL — ABNORMAL HIGH (ref 4.8–5.6)

## 2021-01-27 LAB — PARATHYROID HORMONE, INTACT (NO CA): PTH: 69 pg/mL (ref 16–77)

## 2021-02-06 DIAGNOSIS — M9903 Segmental and somatic dysfunction of lumbar region: Secondary | ICD-10-CM | POA: Diagnosis not present

## 2021-02-06 DIAGNOSIS — M9904 Segmental and somatic dysfunction of sacral region: Secondary | ICD-10-CM | POA: Diagnosis not present

## 2021-02-06 DIAGNOSIS — M5134 Other intervertebral disc degeneration, thoracic region: Secondary | ICD-10-CM | POA: Diagnosis not present

## 2021-02-06 DIAGNOSIS — M9905 Segmental and somatic dysfunction of pelvic region: Secondary | ICD-10-CM | POA: Diagnosis not present

## 2021-02-24 DIAGNOSIS — M9904 Segmental and somatic dysfunction of sacral region: Secondary | ICD-10-CM | POA: Diagnosis not present

## 2021-02-24 DIAGNOSIS — M9905 Segmental and somatic dysfunction of pelvic region: Secondary | ICD-10-CM | POA: Diagnosis not present

## 2021-02-24 DIAGNOSIS — M9903 Segmental and somatic dysfunction of lumbar region: Secondary | ICD-10-CM | POA: Diagnosis not present

## 2021-02-24 DIAGNOSIS — M5134 Other intervertebral disc degeneration, thoracic region: Secondary | ICD-10-CM | POA: Diagnosis not present

## 2021-03-07 ENCOUNTER — Ambulatory Visit: Payer: Medicare Other | Admitting: Orthopaedic Surgery

## 2021-03-07 ENCOUNTER — Ambulatory Visit: Payer: Self-pay

## 2021-03-07 ENCOUNTER — Encounter: Payer: Self-pay | Admitting: Orthopaedic Surgery

## 2021-03-07 ENCOUNTER — Other Ambulatory Visit: Payer: Self-pay

## 2021-03-07 VITALS — BP 117/69 | HR 74 | Ht 63.0 in | Wt 150.0 lb

## 2021-03-07 DIAGNOSIS — M25551 Pain in right hip: Secondary | ICD-10-CM | POA: Diagnosis not present

## 2021-03-07 DIAGNOSIS — M25561 Pain in right knee: Secondary | ICD-10-CM | POA: Diagnosis not present
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
## 2021-03-11 DIAGNOSIS — Z1231 Encounter for screening mammogram for malignant neoplasm of breast: Secondary | ICD-10-CM | POA: Diagnosis not present

## 2021-03-17 DIAGNOSIS — M5134 Other intervertebral disc degeneration, thoracic region: Secondary | ICD-10-CM | POA: Diagnosis not present

## 2021-03-17 DIAGNOSIS — M9905 Segmental and somatic dysfunction of pelvic region: Secondary | ICD-10-CM | POA: Diagnosis not present

## 2021-03-17 DIAGNOSIS — M9904 Segmental and somatic dysfunction of sacral region: Secondary | ICD-10-CM | POA: Diagnosis not present

## 2021-03-17 DIAGNOSIS — M9903 Segmental and somatic dysfunction of lumbar region: Secondary | ICD-10-CM | POA: Diagnosis not present

## 2021-04-15 DIAGNOSIS — M9904 Segmental and somatic dysfunction of sacral region: Secondary | ICD-10-CM | POA: Diagnosis not present

## 2021-04-15 DIAGNOSIS — M5136 Other intervertebral disc degeneration, lumbar region: Secondary | ICD-10-CM | POA: Diagnosis not present

## 2021-04-15 DIAGNOSIS — M9903 Segmental and somatic dysfunction of lumbar region: Secondary | ICD-10-CM | POA: Diagnosis not present

## 2021-04-15 DIAGNOSIS — M9905 Segmental and somatic dysfunction of pelvic region: Secondary | ICD-10-CM | POA: Diagnosis not present

## 2021-05-06 DIAGNOSIS — M9904 Segmental and somatic dysfunction of sacral region: Secondary | ICD-10-CM | POA: Diagnosis not present

## 2021-05-06 DIAGNOSIS — M5136 Other intervertebral disc degeneration, lumbar region: Secondary | ICD-10-CM | POA: Diagnosis not present

## 2021-05-06 DIAGNOSIS — M9903 Segmental and somatic dysfunction of lumbar region: Secondary | ICD-10-CM | POA: Diagnosis not present

## 2021-05-06 DIAGNOSIS — M9905 Segmental and somatic dysfunction of pelvic region: Secondary | ICD-10-CM | POA: Diagnosis not present

## 2021-05-13 ENCOUNTER — Other Ambulatory Visit: Payer: Self-pay | Admitting: Family Medicine

## 2021-05-13 DIAGNOSIS — H04123 Dry eye syndrome of bilateral lacrimal glands: Secondary | ICD-10-CM | POA: Diagnosis not present

## 2021-05-13 DIAGNOSIS — H2513 Age-related nuclear cataract, bilateral: Secondary | ICD-10-CM | POA: Diagnosis not present

## 2021-05-26 DIAGNOSIS — M9903 Segmental and somatic dysfunction of lumbar region: Secondary | ICD-10-CM | POA: Diagnosis not present

## 2021-05-26 DIAGNOSIS — M9904 Segmental and somatic dysfunction of sacral region: Secondary | ICD-10-CM | POA: Diagnosis not present

## 2021-05-26 DIAGNOSIS — M5136 Other intervertebral disc degeneration, lumbar region: Secondary | ICD-10-CM | POA: Diagnosis not present

## 2021-05-26 DIAGNOSIS — M9905 Segmental and somatic dysfunction of pelvic region: Secondary | ICD-10-CM | POA: Diagnosis not present

## 2021-06-18 DIAGNOSIS — M9904 Segmental and somatic dysfunction of sacral region: Secondary | ICD-10-CM | POA: Diagnosis not present

## 2021-06-18 DIAGNOSIS — M9903 Segmental and somatic dysfunction of lumbar region: Secondary | ICD-10-CM | POA: Diagnosis not present

## 2021-06-18 DIAGNOSIS — M5136 Other intervertebral disc degeneration, lumbar region: Secondary | ICD-10-CM | POA: Diagnosis not present

## 2021-06-18 DIAGNOSIS — M9905 Segmental and somatic dysfunction of pelvic region: Secondary | ICD-10-CM | POA: Diagnosis not present

## 2021-07-08 DIAGNOSIS — M9905 Segmental and somatic dysfunction of pelvic region: Secondary | ICD-10-CM | POA: Diagnosis not present

## 2021-07-08 DIAGNOSIS — M9904 Segmental and somatic dysfunction of sacral region: Secondary | ICD-10-CM | POA: Diagnosis not present

## 2021-07-08 DIAGNOSIS — M9903 Segmental and somatic dysfunction of lumbar region: Secondary | ICD-10-CM | POA: Diagnosis not present

## 2021-07-08 DIAGNOSIS — M5136 Other intervertebral disc degeneration, lumbar region: Secondary | ICD-10-CM | POA: Diagnosis not present

## 2021-07-16 ENCOUNTER — Telehealth: Payer: Self-pay | Admitting: Family Medicine

## 2021-07-16 NOTE — Telephone Encounter (Signed)
Left message asking pt to call [phone]  Schedule change   Please r/s 08/27/21 [facility] appt

## 2021-07-29 DIAGNOSIS — M9905 Segmental and somatic dysfunction of pelvic region: Secondary | ICD-10-CM | POA: Diagnosis not present

## 2021-07-29 DIAGNOSIS — M5136 Other intervertebral disc degeneration, lumbar region: Secondary | ICD-10-CM | POA: Diagnosis not present

## 2021-07-29 DIAGNOSIS — M9903 Segmental and somatic dysfunction of lumbar region: Secondary | ICD-10-CM | POA: Diagnosis not present

## 2021-07-29 DIAGNOSIS — M9904 Segmental and somatic dysfunction of sacral region: Secondary | ICD-10-CM | POA: Diagnosis not present

## 2021-08-08 ENCOUNTER — Other Ambulatory Visit: Payer: Self-pay

## 2021-08-08 ENCOUNTER — Ambulatory Visit: Payer: Medicare Other | Admitting: Internal Medicine

## 2021-08-08 ENCOUNTER — Encounter: Payer: Self-pay | Admitting: Internal Medicine

## 2021-08-08 VITALS — BP 126/78 | HR 74 | Ht 63.0 in | Wt 152.2 lb

## 2021-08-08 DIAGNOSIS — E210 Primary hyperparathyroidism: Secondary | ICD-10-CM | POA: Diagnosis not present

## 2021-08-08 NOTE — Patient Instructions (Signed)
-   Maintain proper hydration  - Avoid over the counter calcium tablets but  maintain 2-3 servings of dairy in your diet ( low fat ) - Continue Vitamin D 2000 iu daily
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
## 2021-08-09 LAB — BASIC METABOLIC PANEL
BUN/Creatinine Ratio: 33 — ABNORMAL HIGH (ref 12–28)
BUN: 19 mg/dL (ref 8–27)
CO2: 24 mmol/L (ref 20–29)
Calcium: 11.2 mg/dL — ABNORMAL HIGH (ref 8.7–10.3)
Chloride: 105 mmol/L (ref 96–106)
Creatinine, Ser: 0.57 mg/dL (ref 0.57–1.00)
Glucose: 105 mg/dL — ABNORMAL HIGH (ref 70–99)
Potassium: 4.7 mmol/L (ref 3.5–5.2)
Sodium: 141 mmol/L (ref 134–144)
eGFR: 102 mL/min/{1.73_m2} (ref >59)

## 2021-08-09 LAB — VITAMIN D 25 HYDROXY (VIT D DEFICIENCY, FRACTURES): Vit D, 25-Hydroxy: 31.6 ng/mL (ref 30.0–100.0)

## 2021-08-09 LAB — ALBUMIN: Albumin: 4.6 g/dL (ref 3.8–4.8)

## 2021-08-09 LAB — PARATHYROID HORMONE, INTACT (NO CA): PTH: 43 pg/mL (ref 15–65)

## 2021-08-12 ENCOUNTER — Encounter: Payer: Self-pay | Admitting: Orthopaedic Surgery

## 2021-08-12 ENCOUNTER — Other Ambulatory Visit: Payer: Self-pay

## 2021-08-12 ENCOUNTER — Ambulatory Visit: Payer: Self-pay

## 2021-08-12 ENCOUNTER — Ambulatory Visit: Payer: Medicare Other | Admitting: Orthopaedic Surgery

## 2021-08-12 VITALS — BP 113/77 | HR 66 | Ht 63.0 in | Wt 152.0 lb

## 2021-08-12 DIAGNOSIS — M545 Low back pain, unspecified: Secondary | ICD-10-CM | POA: Diagnosis not present

## 2021-08-12 DIAGNOSIS — M21371 Foot drop, right foot: Secondary | ICD-10-CM | POA: Diagnosis not present

## 2021-08-12 DIAGNOSIS — G8929 Other chronic pain: Secondary | ICD-10-CM

## 2021-08-12 DIAGNOSIS — M4316 Spondylolisthesis, lumbar region: Secondary | ICD-10-CM | POA: Diagnosis not present
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
## 2021-08-15 ENCOUNTER — Ambulatory Visit (INDEPENDENT_AMBULATORY_CARE_PROVIDER_SITE_OTHER): Payer: Medicare Other | Admitting: Family Medicine

## 2021-08-15 ENCOUNTER — Encounter: Payer: Self-pay | Admitting: Family Medicine

## 2021-08-15 VITALS — BP 126/78 | HR 72 | Temp 98.3°F | Wt 152.4 lb

## 2021-08-15 DIAGNOSIS — E039 Hypothyroidism, unspecified: Secondary | ICD-10-CM | POA: Diagnosis not present

## 2021-08-15 DIAGNOSIS — R739 Hyperglycemia, unspecified: Secondary | ICD-10-CM | POA: Diagnosis not present

## 2021-08-15 LAB — CBC WITH DIFFERENTIAL/PLATELET
Basophils Absolute: 0 10*3/uL (ref 0.0–0.1)
Basophils Relative: 0.5 % (ref 0.0–3.0)
Eosinophils Absolute: 0.1 10*3/uL (ref 0.0–0.7)
Eosinophils Relative: 1.9 % (ref 0.0–5.0)
HCT: 42.7 % (ref 36.0–46.0)
Hemoglobin: 14.1 g/dL (ref 12.0–15.0)
Lymphocytes Relative: 20.8 % (ref 12.0–46.0)
Lymphs Abs: 1.1 10*3/uL (ref 0.7–4.0)
MCHC: 32.9 g/dL (ref 30.0–36.0)
MCV: 90.8 fl (ref 78.0–100.0)
Monocytes Absolute: 0.5 10*3/uL (ref 0.1–1.0)
Monocytes Relative: 9.6 % (ref 3.0–12.0)
Neutro Abs: 3.6 10*3/uL (ref 1.4–7.7)
Neutrophils Relative %: 67.2 % (ref 43.0–77.0)
Platelets: 215 10*3/uL (ref 150.0–400.0)
RBC: 4.71 Mil/uL (ref 3.87–5.11)
RDW: 13.8 % (ref 11.5–15.5)
WBC: 5.4 10*3/uL (ref 4.0–10.5)

## 2021-08-15 LAB — HEPATIC FUNCTION PANEL
ALT: 43 U/L — ABNORMAL HIGH (ref 0–35)
AST: 36 U/L (ref 0–37)
Albumin: 4.5 g/dL (ref 3.5–5.2)
Alkaline Phosphatase: 63 U/L (ref 39–117)
Bilirubin, Direct: 0.1 mg/dL (ref 0.0–0.3)
Total Bilirubin: 0.3 mg/dL (ref 0.2–1.2)
Total Protein: 7.6 g/dL (ref 6.0–8.3)

## 2021-08-15 LAB — T3, FREE: T3, Free: 3.2 pg/mL (ref 2.3–4.2)

## 2021-08-15 LAB — HEMOGLOBIN A1C: Hgb A1c MFr Bld: 5.6 % (ref 4.6–6.5)

## 2021-08-15 LAB — TSH: TSH: 1.88 u[IU]/mL (ref 0.35–5.50)

## 2021-08-15 LAB — T4, FREE: Free T4: 0.85 ng/dL (ref 0.60–1.60)

## 2021-08-15 MED ORDER — LEVOTHYROXINE SODIUM 25 MCG PO TABS: 25.0000 ug | ORAL_TABLET | Freq: Every day | ORAL | 3 refills | Status: DC

## 2021-08-15 NOTE — Progress Notes (Signed)
° °  Subjective:    Patient ID: Christy Douglas, female    DOB: 1956-03-07, 66 y.o.   MRN: [id number]  HPI Here to follow up on hypothyroidism. She feels fine except for some low back pain that she sees Dr. Lorin Mercy for. She saw Dr. Kelton Pillar last week for hyperparathyroidism. Her levels have been stable with Ca 11.2, PTH 43, and creatinine 0.57.    Review of Systems  Constitutional: Negative.   HENT: Negative.    Eyes: Negative.   Respiratory: Negative.    Cardiovascular: Negative.   Gastrointestinal: Negative.   Genitourinary:  Negative for decreased urine volume, difficulty urinating, dyspareunia, dysuria, enuresis, flank pain, frequency, hematuria, pelvic pain and urgency.  Musculoskeletal: Negative.   Skin: Negative.   Neurological: Negative.  Negative for headaches.  Psychiatric/Behavioral: Negative.        Objective:   Physical Exam Constitutional:      General: She is not in acute distress.    Appearance: She is well-developed.  HENT:     Head: Normocephalic and atraumatic.     Right Ear: External ear normal.     Left Ear: External ear normal.     Nose: Nose normal.     Mouth/Throat:     Pharynx: No oropharyngeal exudate.  Eyes:     General: No scleral icterus.    Conjunctiva/sclera: Conjunctivae normal.     Pupils: Pupils are equal, round, and reactive to light.  Neck:     Thyroid: No thyromegaly.     Vascular: No JVD.  Cardiovascular:     Rate and Rhythm: Normal rate and regular rhythm.     Heart sounds: Normal heart sounds. No murmur heard.   No friction rub. No gallop.  Pulmonary:     Effort: Pulmonary effort is normal. No respiratory distress.     Breath sounds: Normal breath sounds. No wheezing or rales.  Chest:     Chest wall: No tenderness.  Abdominal:     General: Bowel sounds are normal. There is no distension.     Palpations: Abdomen is soft. There is no mass.     Tenderness: There is no abdominal tenderness. There is no guarding or rebound.   Musculoskeletal:        General: No tenderness. Normal range of motion.     Cervical back: Normal range of motion and neck supple.  Lymphadenopathy:     Cervical: No cervical adenopathy.  Skin:    General: Skin is warm and dry.     Findings: No erythema or rash.  Neurological:     Mental Status: She is alert and oriented to person, place, and time.     Cranial Nerves: No cranial nerve deficit.     Motor: No abnormal muscle tone.     Coordination: Coordination normal.     Deep Tendon Reflexes: Reflexes are normal and symmetric. Reflexes normal.  Psychiatric:        Behavior: Behavior normal.        Thought Content: Thought content normal.        Judgment: Judgment normal.          Assessment & Plan:  Hypothyroidism. We will get labs today including a thyroid panel.  Alysia Penna, MD

## 2021-08-20 DIAGNOSIS — M9903 Segmental and somatic dysfunction of lumbar region: Secondary | ICD-10-CM | POA: Diagnosis not present

## 2021-08-20 DIAGNOSIS — M5136 Other intervertebral disc degeneration, lumbar region: Secondary | ICD-10-CM | POA: Diagnosis not present

## 2021-08-20 DIAGNOSIS — M9904 Segmental and somatic dysfunction of sacral region: Secondary | ICD-10-CM | POA: Diagnosis not present

## 2021-08-20 DIAGNOSIS — M9905 Segmental and somatic dysfunction of pelvic region: Secondary | ICD-10-CM | POA: Diagnosis not present

## 2021-08-25 ENCOUNTER — Ambulatory Visit (INDEPENDENT_AMBULATORY_CARE_PROVIDER_SITE_OTHER): Payer: Medicare Other

## 2021-08-25 VITALS — Ht 63.0 in | Wt 149.0 lb

## 2021-08-25 DIAGNOSIS — Z0000 Encounter for general adult medical examination without abnormal findings: Secondary | ICD-10-CM | POA: Diagnosis not present
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
## 2021-08-25 NOTE — Patient Instructions (Signed)
Christy Douglas , Thank you for taking time to come for your Medicare Wellness Visit. I appreciate your ongoing commitment to your health goals. Please review the following plan we discussed and let me know if I can assist you in the future.   Screening recommendations/referrals: Colonoscopy: completed 02/22/2020 Mammogram: completed 03/11/2021 Bone Density: completed 10/14/2016 Recommended yearly ophthalmology/optometry visit for glaucoma screening and checkup Recommended yearly dental visit for hygiene and checkup  Vaccinations: Influenza vaccine: completed 05/10/2021 Pneumococcal vaccine: n/a Tdap vaccine: due Shingles vaccine: needs second dose  Covid-19:  05/21/2021, 06/12/2020, 10/30/2019, 10/05/2019  Advanced directives: Please bring a copy of your POA (Power of Attorney) and/or Living Will to your next appointment.   Conditions/risks identified: none  Next appointment: Follow up in one year for your annual wellness visit.   Preventive Care 40-64 Years, Female Preventive care refers to lifestyle choices and visits with your health care provider that can promote health and wellness. What does preventive care include? A yearly physical exam. This is also called an annual well check. Dental exams once or twice a year. Routine eye exams. Ask your health care provider how often you should have your eyes checked. Personal lifestyle choices, including: Daily care of your teeth and gums. Regular physical activity. Eating a healthy diet. Avoiding tobacco and drug use. Limiting alcohol use. Practicing safe sex. Taking low-dose aspirin daily starting at age 53. Taking vitamin and mineral supplements as recommended by your health care provider. What happens during an annual well check? The services and screenings done by your health care provider during your annual well check will depend on your age, overall health, lifestyle risk factors, and family history of disease. Counseling  Your health  care provider may ask you questions about your: Alcohol use. Tobacco use. Drug use. Emotional well-being. Home and relationship well-being. Sexual activity. Eating habits. Work and work Statistician. Method of birth control. Menstrual cycle. Pregnancy history. Screening  You may have the following tests or measurements: Height, weight, and BMI. Blood pressure. Lipid and cholesterol levels. These may be checked every 5 years, or more frequently if you are over 17 years old. Skin check. Lung cancer screening. You may have this screening every year starting at age 67 if you have a 30-pack-year history of smoking and currently smoke or have quit within the past 15 years. Fecal occult blood test (FOBT) of the stool. You may have this test every year starting at age 24. Flexible sigmoidoscopy or colonoscopy. You may have a sigmoidoscopy every 5 years or a colonoscopy every 10 years starting at age 68. Hepatitis C blood test. Hepatitis B blood test. Sexually transmitted disease (STD) testing. Diabetes screening. This is done by checking your blood sugar (glucose) after you have not eaten for a while (fasting). You may have this done every 1-3 years. Mammogram. This may be done every 1-2 years. Talk to your health care provider about when you should start having regular mammograms. This may depend on whether you have a family history of breast cancer. BRCA-related cancer screening. This may be done if you have a family history of breast, ovarian, tubal, or peritoneal cancers. Pelvic exam and Pap test. This may be done every 3 years starting at age 76. Starting at age 12, this may be done every 5 years if you have a Pap test in combination with an HPV test. Bone density scan. This is done to screen for osteoporosis. You may have this scan if you are at high risk for osteoporosis.  Discuss your test results, treatment options, and if necessary, the need for more tests with your health care  provider. Vaccines  Your health care provider may recommend certain vaccines, such as: Influenza vaccine. This is recommended every year. Tetanus, diphtheria, and acellular pertussis (Tdap, Td) vaccine. You may need a Td booster every 10 years. Zoster vaccine. You may need this after age 42. Pneumococcal 13-valent conjugate (PCV13) vaccine. You may need this if you have certain conditions and were not previously vaccinated. Pneumococcal polysaccharide (PPSV23) vaccine. You may need one or two doses if you smoke cigarettes or if you have certain conditions. Talk to your health care provider about which screenings and vaccines you need and how often you need them. This information is not intended to replace advice given to you by your health care provider. Make sure you discuss any questions you have with your health care provider. Document Released: 08/16/2015 Document Revised: 04/08/2016 Document Reviewed: 05/21/2015 Elsevier Interactive Patient Education  2017 [employer] Prevention in the Home Falls can cause injuries. They can happen to people of all ages. There are many things you can do to make your home safe and to help prevent falls. What can I do on the outside of my home? Regularly fix the edges of walkways and driveways and fix any cracks. Remove anything that might make you trip as you walk through a door, such as a raised step or threshold. Trim any bushes or trees on the path to your home. Use bright outdoor lighting. Clear any walking paths of anything that might make someone trip, such as rocks or tools. Regularly check to see if handrails are loose or broken. Make sure that both sides of any steps have handrails. Any raised decks and porches should have guardrails on the edges. Have any leaves, snow, or ice cleared regularly. Use sand or salt on walking paths during winter. Clean up any spills in your garage right away. This includes oil or grease spills. What  can I do in the bathroom? Use night lights. Install grab bars by the toilet and in the tub and shower. Do not use towel bars as grab bars. Use non-skid mats or decals in the tub or shower. If you need to sit down in the shower, use a plastic, non-slip stool. Keep the floor dry. Clean up any water that spills on the floor as soon as it happens. Remove soap buildup in the tub or shower regularly. Attach bath mats securely with double-sided non-slip rug tape. Do not have throw rugs and other things on the floor that can make you trip. What can I do in the bedroom? Use night lights. Make sure that you have a light by your bed that is easy to reach. Do not use any sheets or blankets that are too big for your bed. They should not hang down onto the floor. Have a firm chair that has side arms. You can use this for support while you get dressed. Do not have throw rugs and other things on the floor that can make you trip. What can I do in the kitchen? Clean up any spills right away. Avoid walking on wet floors. Keep items that you use a lot in easy-to-reach places. If you need to reach something above you, use a strong step stool that has a grab bar. Keep electrical cords out of the way. Do not use floor polish or wax that makes floors slippery. If you must use wax,  use non-skid floor wax. Do not have throw rugs and other things on the floor that can make you trip. What can I do with my stairs? Do not leave any items on the stairs. Make sure that there are handrails on both sides of the stairs and use them. Fix handrails that are broken or loose. Make sure that handrails are as long as the stairways. Check any carpeting to make sure that it is firmly attached to the stairs. Fix any carpet that is loose or worn. Avoid having throw rugs at the top or bottom of the stairs. If you do have throw rugs, attach them to the floor with carpet tape. Make sure that you have a light switch at the top of the  stairs and the bottom of the stairs. If you do not have them, ask someone to add them for you. What else can I do to help prevent falls? Wear shoes that: Do not have high heels. Have rubber bottoms. Are comfortable and fit you well. Are closed at the toe. Do not wear sandals. If you use a stepladder: Make sure that it is fully opened. Do not climb a closed stepladder. Make sure that both sides of the stepladder are locked into place. Ask someone to hold it for you, if possible. Clearly mark and make sure that you can see: Any grab bars or handrails. First and last steps. Where the edge of each step is. Use tools that help you move around (mobility aids) if they are needed. These include: Canes. Walkers. Scooters. Crutches. Turn on the lights when you go into a dark area. Replace any light bulbs as soon as they burn out. Set up your furniture so you have a clear path. Avoid moving your furniture around. If any of your floors are uneven, fix them. If there are any pets around you, be aware of where they are. Review your medicines with your doctor. Some medicines can make you feel dizzy. This can increase your chance of falling. Ask your doctor what other things that you can do to help prevent falls. This information is not intended to replace advice given to you by your health care provider. Make sure you discuss any questions you have with your health care provider. Document Released: 05/16/2009 Document Revised: 12/26/2015 Document Reviewed: 08/24/2014 Elsevier Interactive Patient Education  2017 [employer].

## 2021-08-27 ENCOUNTER — Ambulatory Visit: Payer: Medicare Other

## 2021-08-28 ENCOUNTER — Other Ambulatory Visit: Payer: Medicare Other

## 2021-08-29 ENCOUNTER — Ambulatory Visit
Admission: RE | Admit: 2021-08-29 | Discharge: 2021-08-29 | Disposition: A | Discharge status: Home / Self Care | Payer: Medicare Other | Source: Ambulatory Visit | Attending: Orthopaedic Surgery | Admitting: Orthopaedic Surgery

## 2021-08-29 DIAGNOSIS — M21371 Foot drop, right foot: Secondary | ICD-10-CM | POA: Diagnosis not present

## 2021-08-29 DIAGNOSIS — M79604 Pain in right leg: Secondary | ICD-10-CM | POA: Diagnosis not present

## 2021-08-29 DIAGNOSIS — G8929 Other chronic pain: Secondary | ICD-10-CM

## 2021-08-29 DIAGNOSIS — M545 Low back pain, unspecified: Secondary | ICD-10-CM | POA: Diagnosis not present

## 2021-08-29 DIAGNOSIS — M4316 Spondylolisthesis, lumbar region: Secondary | ICD-10-CM

## 2021-08-29 DIAGNOSIS — M48061 Spinal stenosis, lumbar region without neurogenic claudication: Secondary | ICD-10-CM | POA: Diagnosis not present

## 2021-09-01 ENCOUNTER — Telehealth: Payer: Self-pay | Admitting: Orthopaedic Surgery

## 2021-09-01 NOTE — Telephone Encounter (Signed)
Pt called stating she had an MRI on 08/29/21 and would like a CB with her results. Pt states she is very concerned so she would like a CB asap please.   [phone]

## 2021-09-01 NOTE — Telephone Encounter (Signed)
Please advise 

## 2021-09-08 DIAGNOSIS — M9904 Segmental and somatic dysfunction of sacral region: Secondary | ICD-10-CM | POA: Diagnosis not present

## 2021-09-08 DIAGNOSIS — M5136 Other intervertebral disc degeneration, lumbar region: Secondary | ICD-10-CM | POA: Diagnosis not present

## 2021-09-08 DIAGNOSIS — M9903 Segmental and somatic dysfunction of lumbar region: Secondary | ICD-10-CM | POA: Diagnosis not present

## 2021-09-08 DIAGNOSIS — M9905 Segmental and somatic dysfunction of pelvic region: Secondary | ICD-10-CM | POA: Diagnosis not present

## 2021-09-12 ENCOUNTER — Ambulatory Visit: Payer: Medicare Other | Admitting: Orthopaedic Surgery

## 2021-09-12 ENCOUNTER — Ambulatory Visit: Payer: Self-pay

## 2021-09-12 ENCOUNTER — Other Ambulatory Visit: Payer: Self-pay

## 2021-09-12 ENCOUNTER — Encounter: Payer: Self-pay | Admitting: Orthopaedic Surgery

## 2021-09-12 VITALS — BP 117/74 | HR 65 | Ht 63.0 in | Wt 149.0 lb

## 2021-09-12 DIAGNOSIS — G8929 Other chronic pain: Secondary | ICD-10-CM

## 2021-09-12 DIAGNOSIS — M545 Low back pain, unspecified: Secondary | ICD-10-CM | POA: Diagnosis not present

## 2021-09-12 DIAGNOSIS — M7061 Trochanteric bursitis, right hip: Secondary | ICD-10-CM

## 2021-09-22 DIAGNOSIS — M7061 Trochanteric bursitis, right hip: Secondary | ICD-10-CM | POA: Insufficient documentation

## 2021-09-22 MED ORDER — LIDOCAINE HCL 1 % IJ SOLN
1.0000 mL | INTRAMUSCULAR | Status: AC | PRN
  Administered 2021-09-22: 1 mL

## 2021-09-22 MED ORDER — METHYLPREDNISOLONE ACETATE 40 MG/ML IJ SUSP
40.0000 mg | INTRAMUSCULAR | Status: AC | PRN
  Administered 2021-09-22: 40 mg via INTRA_ARTICULAR

## 2021-09-22 MED ORDER — BUPIVACAINE HCL 0.25 % IJ SOLN
2.0000 mL | INTRAMUSCULAR | Status: AC | PRN
  Administered 2021-09-22: 2 mL via INTRA_ARTICULAR
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
## 2021-09-23 DIAGNOSIS — M9905 Segmental and somatic dysfunction of pelvic region: Secondary | ICD-10-CM | POA: Diagnosis not present

## 2021-09-23 DIAGNOSIS — M9903 Segmental and somatic dysfunction of lumbar region: Secondary | ICD-10-CM | POA: Diagnosis not present

## 2021-09-23 DIAGNOSIS — M5136 Other intervertebral disc degeneration, lumbar region: Secondary | ICD-10-CM | POA: Diagnosis not present

## 2021-09-23 DIAGNOSIS — M9904 Segmental and somatic dysfunction of sacral region: Secondary | ICD-10-CM | POA: Diagnosis not present

## 2021-09-26 ENCOUNTER — Ambulatory Visit: Payer: Self-pay

## 2021-09-26 ENCOUNTER — Other Ambulatory Visit: Payer: Self-pay

## 2021-09-26 ENCOUNTER — Encounter: Payer: Self-pay | Admitting: Orthopaedic Surgery

## 2021-09-26 ENCOUNTER — Ambulatory Visit: Payer: Medicare Other | Admitting: Orthopaedic Surgery

## 2021-09-26 VITALS — BP 118/72 | HR 80 | Ht 63.0 in | Wt 149.0 lb

## 2021-09-26 DIAGNOSIS — M25551 Pain in right hip: Secondary | ICD-10-CM | POA: Diagnosis not present
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
## 2021-10-06 ENCOUNTER — Other Ambulatory Visit: Payer: Self-pay

## 2021-10-16 ENCOUNTER — Encounter: Payer: Self-pay | Admitting: Surgery

## 2021-10-16 ENCOUNTER — Ambulatory Visit (INDEPENDENT_AMBULATORY_CARE_PROVIDER_SITE_OTHER): Payer: Medicare Other | Admitting: Surgery

## 2021-10-16 ENCOUNTER — Other Ambulatory Visit: Payer: Self-pay

## 2021-10-16 VITALS — BP 109/74 | HR 74

## 2021-10-16 DIAGNOSIS — M1611 Unilateral primary osteoarthritis, right hip: Secondary | ICD-10-CM

## 2021-10-16 NOTE — Progress Notes (Signed)
66 year old white female history of end-stage DJD right hip and pain comes in for preop evaluation.  States that hip symptoms unchanged from previous visit and she is wanting to proceed with right total hip replacement as scheduled.  Today history and physical performed.  Review of systems negative.   ? ? ?Plan ?Surgical procedure discussed along potential rehab/recovery time.  Patient is requesting home health PT immediately postop.  She does have a history of right dropfoot and she does have Some concern about slow progress.  I think home health PT is very reasonable.  All questions answered. ?

## 2021-10-20 NOTE — Pre-Procedure Instructions (Signed)
Surgical Instructions ? ? ? Your procedure is scheduled on Friday 10/24/21. ? ? Report to [facility] Main Entrance "A" at 08:15 A.M., then check in with the Admitting office. ? Call this number if you have problems the morning of surgery: ? [phone] ? ? If you have any questions prior to your surgery date call [phone]: Open Monday-Friday 8am-4pm ? ? ? Remember: ? Do not eat after midnight the night before your surgery ? ?You may drink clear liquids until 07:15 A.M. the morning of your surgery.   ?Clear liquids allowed are: Water, Non-Citrus Juices (without pulp), Carbonated Beverages, Clear Tea, Black Coffee ONLY (NO MILK, CREAM OR POWDERED CREAMER of any kind), and Gatorade ?  ? Take these medicines the morning of surgery with A SIP OF WATER:  ? levothyroxine (SYNTHROID) ? Polyethyl Glycol-Propyl Glycol (SYSTANE OP) ? ? ? Take these medicines if needed:  ? acetaminophen (TYLENOL)  ? Azelastine HCl (ASTEPRO NA) ? ? ?As of today, STOP taking any Aspirin (unless otherwise instructed by your surgeon) Aleve, Naproxen, Ibuprofen, Motrin, Advil, Goody's, BC's, all herbal medications, fish oil, and all vitamins. ? ?         ?Do not wear jewelry or makeup ?Do not wear lotions, powders, perfumes/colognes, or deodorant. ?Do not shave 48 hours prior to surgery.  Men may shave face and neck. ?Do not bring valuables to the hospital. ?Do not wear nail polish, gel polish, artificial nails, or any other type of covering on natural nails (fingers and toes) ?If you have artificial nails or gel coating that need to be removed by a nail salon, please have this removed prior to surgery. Artificial nails or gel coating may interfere with anesthesia's ability to adequately monitor your vital signs. ? ?[facility] is not responsible for any belongings or valuables. .  ? ?Do NOT Smoke (Tobacco/Vaping)  24 hours prior to your procedure ? ?If you use a CPAP at night, you may bring your mask for your overnight stay. ?  ?Contacts,  glasses, hearing aids, dentures or partials may not be worn into surgery, please bring cases for these belongings ?  ?For patients admitted to the hospital, discharge time will be determined by your treatment team. ?  ?Patients discharged the day of surgery will not be allowed to drive home, and someone needs to stay with them for 24 hours. ? ?NO VISITORS WILL BE ALLOWED IN PRE-OP WHERE PATIENTS ARE PREPPED FOR SURGERY.  ONLY 1 SUPPORT PERSON MAY BE PRESENT IN THE WAITING ROOM WHILE YOU ARE IN SURGERY.  IF YOU ARE TO BE ADMITTED, ONCE YOU ARE IN YOUR ROOM YOU WILL BE ALLOWED TWO (2) VISITORS. 1 (ONE) VISITOR MAY STAY OVERNIGHT BUT MUST ARRIVE TO THE ROOM BY 8pm.  Minor children may have two parents present. Special consideration for safety and communication needs will be reviewed on a case by case basis. ? ?Special instructions:   ? ?Oral Hygiene is also important to reduce your risk of infection.  Remember - BRUSH YOUR TEETH THE MORNING OF SURGERY WITH YOUR REGULAR TOOTHPASTE ? ? ?[facility]- Preparing For Surgery ? ?Before surgery, you can play an important role. Because skin is not sterile, your skin needs to be as free of germs as possible. You can reduce the number of germs on your skin by washing with CHG (chlorahexidine gluconate) Soap before surgery.  CHG is an antiseptic cleaner which kills germs and bonds with the skin to continue killing germs even after washing.   ? ? ?  Please do not use if you have an allergy to CHG or antibacterial soaps. If your skin becomes reddened/irritated stop using the CHG.  ?Do not shave (including legs and underarms) for at least 48 hours prior to first CHG shower. It is OK to shave your face. ? ?Please follow these instructions carefully. ?  ? ? Shower the NIGHT BEFORE SURGERY and the MORNING OF SURGERY with CHG Soap.  ? If you chose to wash your hair, wash your hair first as usual with your normal shampoo. After you shampoo, rinse your hair and body thoroughly to remove the  shampoo.  Then [employer] and genitals (private parts) with your normal soap and rinse thoroughly to remove soap. ? ?After that Use CHG Soap as you would any other liquid soap. You can apply CHG directly to the skin and wash gently with a scrungie or a clean washcloth.  ? ?Apply the CHG Soap to your body ONLY FROM THE NECK DOWN.  Do not use on open wounds or open sores. Avoid contact with your eyes, ears, mouth and genitals (private parts). Wash Face and genitals (private parts)  with your normal soap.  ? ?Wash thoroughly, paying special attention to the area where your surgery will be performed. ? ?Thoroughly rinse your body with warm water from the neck down. ? ?DO NOT shower/wash with your normal soap after using and rinsing off the CHG Soap. ? ?Pat yourself dry with a CLEAN TOWEL. ? ?Wear CLEAN PAJAMAS to bed the night before surgery ? ?Place CLEAN SHEETS on your bed the night before your surgery ? ?DO NOT SLEEP WITH PETS. ? ? ?Day of Surgery: ? ?Take a shower with CHG soap. ?Wear Clean/Comfortable clothing the morning of surgery ?Do not apply any deodorants/lotions.   ?Remember to brush your teeth WITH YOUR REGULAR TOOTHPASTE. ? ? ? ?COVID testing ? ?If you are going to stay overnight or be admitted after your procedure/surgery and require a pre-op COVID test, please follow these instructions after your COVID test  ? ?You are not required to quarantine however you are required to wear a well-fitting mask when you are out and around people not in your household.  If your mask becomes wet or soiled, replace with a new one. ? ?Wash your hands often with soap and water for 20 seconds or clean your hands with an alcohol-based hand sanitizer that contains at least 60% alcohol. ? ?Do not share personal items. ? ?Notify your provider: ?if you are in close contact with someone who has COVID  ?or if you develop a fever of 100.4 or greater, sneezing, cough, sore throat, shortness of breath or body aches. ? ?  ?Please read  over the following fact sheets that you were given.  ? ?

## 2021-10-21 ENCOUNTER — Encounter (HOSPITAL_COMMUNITY): Payer: Self-pay

## 2021-10-21 ENCOUNTER — Other Ambulatory Visit: Payer: Self-pay

## 2021-10-21 ENCOUNTER — Encounter (HOSPITAL_COMMUNITY)
Admission: RE | Admit: 2021-10-21 | Discharge: 2021-10-21 | Disposition: A | Discharge status: Home / Self Care | Payer: Medicare Other | Source: Ambulatory Visit | Attending: Orthopaedic Surgery | Admitting: Orthopaedic Surgery

## 2021-10-21 VITALS — BP 124/80 | HR 65 | Temp 98.2°F | Resp 18 | Ht 63.0 in | Wt 149.4 lb

## 2021-10-21 DIAGNOSIS — M9904 Segmental and somatic dysfunction of sacral region: Secondary | ICD-10-CM | POA: Diagnosis not present

## 2021-10-21 DIAGNOSIS — M9903 Segmental and somatic dysfunction of lumbar region: Secondary | ICD-10-CM | POA: Diagnosis not present

## 2021-10-21 DIAGNOSIS — Z01812 Encounter for preprocedural laboratory examination: Secondary | ICD-10-CM | POA: Insufficient documentation

## 2021-10-21 DIAGNOSIS — M5136 Other intervertebral disc degeneration, lumbar region: Secondary | ICD-10-CM | POA: Diagnosis not present

## 2021-10-21 DIAGNOSIS — Z01818 Encounter for other preprocedural examination: Secondary | ICD-10-CM

## 2021-10-21 DIAGNOSIS — M9905 Segmental and somatic dysfunction of pelvic region: Secondary | ICD-10-CM | POA: Diagnosis not present

## 2021-10-21 LAB — CBC
HCT: 42.2 % (ref 36.0–46.0)
Hemoglobin: 13.8 g/dL (ref 12.0–15.0)
MCH: 30.8 pg (ref 26.0–34.0)
MCHC: 32.7 g/dL (ref 30.0–36.0)
MCV: 94.2 fL (ref 80.0–100.0)
Platelets: 205 10*3/uL (ref 150–400)
RBC: 4.48 MIL/uL (ref 3.87–5.11)
RDW: 13.7 % (ref 11.5–15.5)
WBC: 5.8 10*3/uL (ref 4.0–10.5)
nRBC: 0 % (ref 0.0–0.2)

## 2021-10-21 LAB — SURGICAL PCR SCREEN
MRSA, PCR: NEGATIVE
Staphylococcus aureus: POSITIVE — AB

## 2021-10-21 LAB — TYPE AND SCREEN
ABO/RH(D): O POS
Antibody Screen: NEGATIVE
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
## 2021-10-21 NOTE — Progress Notes (Signed)
PCP - Dr. Alysia Penna ?Cardiologist - n/a ? ?PPM/ICD - n/a ?Device Orders - n/a ?Rep Notified - n/a ? ?Chest x-ray - n/a ?EKG - n/a ?Stress Test - denies ?ECHO - denies ?Cardiac Cath - denies ? ?Sleep Study - denies ?CPAP - n/a ? ?Fasting Blood Sugar - n/a ?Checks Blood Sugar _____ times a day- n/a ? ?Blood Thinner Instructions: n/a ?Aspirin Instructions: n/a ? ?ERAS Protcol - Yes.  ?PRE-SURGERY Ensure or G2- n/a ? ?COVID TEST- n/a ? ? ?Anesthesia review: No ? ?Patient denies shortness of breath, fever, cough and chest pain at PAT appointment ? ? ?All instructions explained to the patient, with a verbal understanding of the material. Patient agrees to go over the instructions while at home for a better understanding. Patient also instructed to self quarantine after being tested for COVID-19. The opportunity to ask questions was provided. ? ? ?

## 2021-10-23 ENCOUNTER — Telehealth: Payer: Self-pay | Admitting: *Deleted

## 2021-10-23 NOTE — Care Plan (Signed)
[facility] RNCM call to discuss with patient her Right total hip arthroplasty with Dr. Lorin Mercy on 10/24/21. She is an Ortho bundle patient through [facility] and is agreeable to case management. She lives alone, but has a mother who lives very close and will be able to come and stay with her after discharge. She will need a RW and 3in1/BSC. These will be ordered through [facility] to be delivered to her room prior to discharge. Anticipate she will need HHPT after short hospital stay. Dr. Lorin Mercy is agreeable to HHPT and referral sent to [facility]. Choice provided and patient accepted. Reviewed all post op care instructions. Will continue to follow for needs. ?

## 2021-10-23 NOTE — Telephone Encounter (Signed)
Ortho bundle Pre-op call completed. 

## 2021-10-23 NOTE — Telephone Encounter (Signed)
This patient having surgery for a Right total hip with you tomorrow came in last week and saw Jeneen Rinks for her H&P. They discussed HHPT after discharge. Are you ok with me going ahead and setting this up for her since she'll most likely be going home over the weekend? That way I can assure she gets seen. Thank you. ?

## 2021-10-23 NOTE — Progress Notes (Signed)
Pt made aware of surgery time change [id number], arrival 0530. ?

## 2021-10-23 NOTE — Anesthesia Preprocedure Evaluation (Addendum)
Anesthesia Evaluation  ?Patient identified by MRN, date of birth, ID band ?Patient awake ? ? ? ?Reviewed: ?Allergy & Precautions, NPO status , Patient's Chart, lab work & pertinent test results ? ?History of Anesthesia Complications ?Negative for: history of anesthetic complications ? ?Airway ?Mallampati: II ? ?TM Distance: >3 FB ?Neck ROM: Full ? ? ? Dental ? ?(+) Teeth Intact ?  ?Pulmonary ?neg pulmonary ROS,  ?  ?Pulmonary exam normal ? ? ? ? ? ? ? Cardiovascular ?negative cardio ROS ?Normal cardiovascular exam ? ? ?  ?Neuro/Psych ?TBI ?  ? GI/Hepatic ?negative GI ROS, Neg liver ROS,   ?Endo/Other  ?Hypothyroidism  ? Renal/GU ?negative Renal ROS  ?negative genitourinary ?  ?Musculoskeletal ? ?(+) Arthritis , Osteoarthritis,   ? Abdominal ?  ?Peds ? Hematology ?negative hematology ROS ?(+)   ?Anesthesia Other Findings ? ? Reproductive/Obstetrics ? ?  ? ? ? ? ? ? ? ? ? ? ? ? ? ?  ?  ? ? ? ? ? ? ?Anesthesia Physical ?Anesthesia Plan ? ?ASA: 2 ? ?Anesthesia Plan: Spinal  ? ?Post-op Pain Management: Tylenol PO (pre-op)* and Toradol IV (intra-op)*  ? ?Induction:  ? ?PONV Risk Score and Plan: 2 and Propofol infusion, Treatment may vary due to age or medical condition, Ondansetron and TIVA ? ?Airway Management Planned: Nasal Cannula and Simple Face Mask ? ?Additional Equipment: None ? ?Intra-op Plan:  ? ?Post-operative Plan:  ? ?Informed Consent: I have reviewed the patients History and Physical, chart, labs and discussed the procedure including the risks, benefits and alternatives for the proposed anesthesia with the patient or authorized representative who has indicated his/her understanding and acceptance.  ? ? ? ? ? ?Plan Discussed with:  ? ?Anesthesia Plan Comments:   ? ? ? ? ? ?Anesthesia Quick Evaluation ? ?

## 2021-10-24 ENCOUNTER — Ambulatory Visit (HOSPITAL_BASED_OUTPATIENT_CLINIC_OR_DEPARTMENT_OTHER): Payer: Medicare Other | Admitting: Anesthesiology

## 2021-10-24 ENCOUNTER — Other Ambulatory Visit: Payer: Self-pay

## 2021-10-24 ENCOUNTER — Encounter (HOSPITAL_COMMUNITY): Payer: Self-pay | Admitting: Orthopaedic Surgery

## 2021-10-24 ENCOUNTER — Observation Stay (HOSPITAL_COMMUNITY)
Admission: RE | Admit: 2021-10-24 | Discharge: 2021-10-27 | Disposition: A | Discharge status: Home Health | Payer: Medicare Other | Attending: Orthopaedic Surgery | Admitting: Orthopaedic Surgery

## 2021-10-24 ENCOUNTER — Encounter (HOSPITAL_COMMUNITY)
Admission: RE | Disposition: A | Discharge status: Home Health | Payer: Self-pay | Source: Home / Self Care | Attending: Orthopaedic Surgery

## 2021-10-24 ENCOUNTER — Ambulatory Visit (HOSPITAL_COMMUNITY): Payer: Medicare Other | Admitting: Anesthesiology

## 2021-10-24 ENCOUNTER — Observation Stay (HOSPITAL_COMMUNITY): Payer: Medicare Other

## 2021-10-24 ENCOUNTER — Ambulatory Visit (HOSPITAL_COMMUNITY): Payer: Medicare Other

## 2021-10-24 DIAGNOSIS — M1611 Unilateral primary osteoarthritis, right hip: Secondary | ICD-10-CM

## 2021-10-24 DIAGNOSIS — E039 Hypothyroidism, unspecified: Secondary | ICD-10-CM | POA: Insufficient documentation

## 2021-10-24 DIAGNOSIS — Z471 Aftercare following joint replacement surgery: Secondary | ICD-10-CM | POA: Diagnosis not present

## 2021-10-24 DIAGNOSIS — Z79899 Other long term (current) drug therapy: Secondary | ICD-10-CM | POA: Diagnosis not present

## 2021-10-24 DIAGNOSIS — Z96641 Presence of right artificial hip joint: Secondary | ICD-10-CM | POA: Diagnosis not present

## 2021-10-24 DIAGNOSIS — M7989 Other specified soft tissue disorders: Secondary | ICD-10-CM | POA: Diagnosis not present

## 2021-10-24 HISTORY — PX: TOTAL HIP ARTHROPLASTY: SHX124

## 2021-10-24 LAB — ABO/RH: ABO/RH(D): O POS

## 2021-10-24 SURGERY — ARTHROPLASTY, HIP, TOTAL, ANTERIOR APPROACH
Anesthesia: Spinal | Site: Hip | Laterality: Right

## 2021-10-24 MED ORDER — PHENOL 1.4 % MT LIQD: 1.0000 | OROMUCOSAL | Status: DC | PRN

## 2021-10-24 MED ORDER — OXYCODONE HCL 5 MG PO TABS
10.0000 mg | ORAL_TABLET | ORAL | Status: DC | PRN
  Administered 2021-10-24 – 2021-10-26 (×5): 10 mg via ORAL
  Filled 2021-10-24 (×5): qty 2

## 2021-10-24 MED ORDER — LACTATED RINGERS IV SOLN: INTRAVENOUS | Status: DC

## 2021-10-24 MED ORDER — HYDROMORPHONE HCL 1 MG/ML IJ SOLN
0.5000 mg | INTRAMUSCULAR | Status: DC | PRN
  Administered 2021-10-24 – 2021-10-26 (×5): 0.5 mg via INTRAVENOUS
  Filled 2021-10-24 (×4): qty 0.5

## 2021-10-24 MED ORDER — PHENYLEPHRINE HCL (PRESSORS) 10 MG/ML IV SOLN
INTRAVENOUS | Status: DC | PRN
  Administered 2021-10-24 (×2): 120 ug via INTRAVENOUS

## 2021-10-24 MED ORDER — 0.9 % SODIUM CHLORIDE (POUR BTL) OPTIME
TOPICAL | Status: DC | PRN
  Administered 2021-10-24: 1000 mL

## 2021-10-24 MED ORDER — METHOCARBAMOL 500 MG PO TABS
500.0000 mg | ORAL_TABLET | Freq: Four times a day (QID) | ORAL | Status: DC | PRN
  Administered 2021-10-24 – 2021-10-27 (×8): 500 mg via ORAL
  Filled 2021-10-24 (×7): qty 1

## 2021-10-24 MED ORDER — FENTANYL CITRATE (PF) 250 MCG/5ML IJ SOLN
INTRAMUSCULAR | Status: DC | PRN
Start: 2021-10-24 — End: 2021-10-24
  Administered 2021-10-24: 50 ug via INTRAVENOUS

## 2021-10-24 MED ORDER — AMISULPRIDE (ANTIEMETIC) 5 MG/2ML IV SOLN: 10.0000 mg | Freq: Once | INTRAVENOUS | Status: DC | PRN

## 2021-10-24 MED ORDER — OXYCODONE HCL 5 MG PO TABS
ORAL_TABLET | ORAL | Status: AC
  Filled 2021-10-24: qty 1

## 2021-10-24 MED ORDER — ASPIRIN EC 325 MG PO TBEC: 325.0000 mg | DELAYED_RELEASE_TABLET | Freq: Every day | ORAL | 0 refills | Status: AC

## 2021-10-24 MED ORDER — HYDROMORPHONE HCL 1 MG/ML IJ SOLN
INTRAMUSCULAR | Status: AC
  Filled 2021-10-24: qty 1

## 2021-10-24 MED ORDER — BUPIVACAINE LIPOSOME 1.3 % IJ SUSP
INTRAMUSCULAR | Status: AC
  Filled 2021-10-24: qty 20

## 2021-10-24 MED ORDER — MIDAZOLAM HCL 2 MG/2ML IJ SOLN
INTRAMUSCULAR | Status: AC
  Filled 2021-10-24: qty 2

## 2021-10-24 MED ORDER — ACETAMINOPHEN 500 MG PO TABS
1000.0000 mg | ORAL_TABLET | Freq: Once | ORAL | Status: DC
  Filled 2021-10-24: qty 2

## 2021-10-24 MED ORDER — FENTANYL CITRATE (PF) 250 MCG/5ML IJ SOLN
INTRAMUSCULAR | Status: AC
  Filled 2021-10-24: qty 5

## 2021-10-24 MED ORDER — OXYCODONE HCL 5 MG PO TABS
5.0000 mg | ORAL_TABLET | ORAL | Status: DC | PRN
  Administered 2021-10-24: 5 mg via ORAL
  Filled 2021-10-24: qty 1

## 2021-10-24 MED ORDER — METHOCARBAMOL 500 MG PO TABS
ORAL_TABLET | ORAL | Status: AC
  Filled 2021-10-24: qty 1

## 2021-10-24 MED ORDER — SODIUM CHLORIDE 0.9 % IV SOLN: INTRAVENOUS | Status: DC

## 2021-10-24 MED ORDER — METHOCARBAMOL 500 MG PO TABS: 500.0000 mg | ORAL_TABLET | Freq: Four times a day (QID) | ORAL | 0 refills | Status: AC | PRN

## 2021-10-24 MED ORDER — ASPIRIN EC 325 MG PO TBEC
325.0000 mg | DELAYED_RELEASE_TABLET | Freq: Every day | ORAL | Status: DC
  Administered 2021-10-25 – 2021-10-27 (×3): 325 mg via ORAL
  Filled 2021-10-24 (×3): qty 1

## 2021-10-24 MED ORDER — CHLORHEXIDINE GLUCONATE 0.12 % MT SOLN
15.0000 mL | Freq: Once | OROMUCOSAL | Status: AC
  Administered 2021-10-24: 15 mL via OROMUCOSAL
  Filled 2021-10-24: qty 15

## 2021-10-24 MED ORDER — PROPOFOL 10 MG/ML IV BOLUS
INTRAVENOUS | Status: AC
  Filled 2021-10-24: qty 20

## 2021-10-24 MED ORDER — TRANEXAMIC ACID-NACL 1000-0.7 MG/100ML-% IV SOLN
INTRAVENOUS | Status: AC
  Filled 2021-10-24: qty 100

## 2021-10-24 MED ORDER — OXYCODONE-ACETAMINOPHEN 5-325 MG PO TABS: 1.0000 | ORAL_TABLET | ORAL | 0 refills | Status: AC | PRN

## 2021-10-24 MED ORDER — METOCLOPRAMIDE HCL 5 MG/ML IJ SOLN
5.0000 mg | Freq: Three times a day (TID) | INTRAMUSCULAR | Status: DC | PRN
  Administered 2021-10-25: 10 mg via INTRAVENOUS
  Filled 2021-10-24: qty 2

## 2021-10-24 MED ORDER — MENTHOL 3 MG MT LOZG: 1.0000 | LOZENGE | OROMUCOSAL | Status: DC | PRN

## 2021-10-24 MED ORDER — BUPIVACAINE IN DEXTROSE 0.75-8.25 % IT SOLN
INTRATHECAL | Status: DC | PRN
  Administered 2021-10-24: 2 mL via INTRATHECAL

## 2021-10-24 MED ORDER — ACETAMINOPHEN 325 MG PO TABS: 325.0000 mg | ORAL_TABLET | Freq: Four times a day (QID) | ORAL | Status: DC | PRN

## 2021-10-24 MED ORDER — BUPIVACAINE LIPOSOME 1.3 % IJ SUSP
INTRAMUSCULAR | Status: DC | PRN
  Administered 2021-10-24: 10 mL

## 2021-10-24 MED ORDER — ONDANSETRON HCL 4 MG PO TABS
4.0000 mg | ORAL_TABLET | Freq: Four times a day (QID) | ORAL | Status: DC | PRN
  Filled 2021-10-24: qty 1

## 2021-10-24 MED ORDER — VANCOMYCIN HCL IN DEXTROSE 1-5 GM/200ML-% IV SOLN
1000.0000 mg | INTRAVENOUS | Status: AC
  Administered 2021-10-24: 1000 mg via INTRAVENOUS
  Filled 2021-10-24: qty 200

## 2021-10-24 MED ORDER — MAGNESIUM OXIDE -MG SUPPLEMENT 400 (240 MG) MG PO TABS
200.0000 mg | ORAL_TABLET | Freq: Every day | ORAL | Status: DC
Start: 2021-10-24 — End: 2021-10-27
  Administered 2021-10-24 – 2021-10-26 (×3): 200 mg via ORAL
  Filled 2021-10-24 (×3): qty 1

## 2021-10-24 MED ORDER — POLYVINYL ALCOHOL 1.4 % OP SOLN
Freq: Two times a day (BID) | OPHTHALMIC | Status: DC
  Filled 2021-10-24: qty 15

## 2021-10-24 MED ORDER — OXYCODONE HCL 5 MG/5ML PO SOLN: 5.0000 mg | Freq: Once | ORAL | Status: AC | PRN

## 2021-10-24 MED ORDER — PROPOFOL 500 MG/50ML IV EMUL
INTRAVENOUS | Status: DC | PRN
  Administered 2021-10-24: 50 ug/kg/min via INTRAVENOUS

## 2021-10-24 MED ORDER — BUPIVACAINE HCL 0.5 % IJ SOLN
INTRAMUSCULAR | Status: DC | PRN
  Administered 2021-10-24: 10 mL

## 2021-10-24 MED ORDER — METOCLOPRAMIDE HCL 5 MG PO TABS: 5.0000 mg | ORAL_TABLET | Freq: Three times a day (TID) | ORAL | Status: DC | PRN

## 2021-10-24 MED ORDER — ORAL CARE MOUTH RINSE: 15.0000 mL | Freq: Once | OROMUCOSAL | Status: AC

## 2021-10-24 MED ORDER — VITAMIN E 45 MG (100 UNIT) PO CAPS: 400.0000 [IU] | ORAL_CAPSULE | Freq: Every day | ORAL | Status: DC

## 2021-10-24 MED ORDER — OXYCODONE HCL 5 MG PO TABS
5.0000 mg | ORAL_TABLET | Freq: Once | ORAL | Status: AC | PRN
  Administered 2021-10-24: 5 mg via ORAL

## 2021-10-24 MED ORDER — LEVOTHYROXINE SODIUM 25 MCG PO TABS
25.0000 ug | ORAL_TABLET | Freq: Every day | ORAL | Status: DC
  Administered 2021-10-25 – 2021-10-27 (×3): 25 ug via ORAL
  Filled 2021-10-24 (×3): qty 1

## 2021-10-24 MED ORDER — FENTANYL CITRATE (PF) 100 MCG/2ML IJ SOLN
INTRAMUSCULAR | Status: AC
  Filled 2021-10-24: qty 2

## 2021-10-24 MED ORDER — PHENYLEPHRINE HCL-NACL 20-0.9 MG/250ML-% IV SOLN
INTRAVENOUS | Status: DC | PRN
  Administered 2021-10-24: 25 ug/min via INTRAVENOUS

## 2021-10-24 MED ORDER — MIDAZOLAM HCL 5 MG/5ML IJ SOLN
INTRAMUSCULAR | Status: DC | PRN
Start: 2021-10-24 — End: 2021-10-24
  Administered 2021-10-24: 2 mg via INTRAVENOUS

## 2021-10-24 MED ORDER — FENTANYL CITRATE (PF) 100 MCG/2ML IJ SOLN
25.0000 ug | INTRAMUSCULAR | Status: AC | PRN
  Administered 2021-10-24 (×6): 25 ug via INTRAVENOUS

## 2021-10-24 MED ORDER — ALBUMIN HUMAN 5 % IV SOLN: INTRAVENOUS | Status: DC | PRN

## 2021-10-24 MED ORDER — TRANEXAMIC ACID-NACL 1000-0.7 MG/100ML-% IV SOLN
INTRAVENOUS | Status: DC | PRN
  Administered 2021-10-24: 1000 mg via INTRAVENOUS

## 2021-10-24 MED ORDER — VITAMIN D 25 MCG (1000 UNIT) PO TABS
1000.0000 [IU] | ORAL_TABLET | Freq: Two times a day (BID) | ORAL | Status: DC
  Administered 2021-10-24 – 2021-10-27 (×6): 1000 [IU] via ORAL
  Filled 2021-10-24 (×6): qty 1

## 2021-10-24 MED ORDER — ONDANSETRON HCL 4 MG/2ML IJ SOLN
INTRAMUSCULAR | Status: DC | PRN
  Administered 2021-10-24: 4 mg via INTRAVENOUS

## 2021-10-24 MED ORDER — BUPIVACAINE LIPOSOME 1.3 % IJ SUSP
10.0000 mL | Freq: Once | INTRAMUSCULAR | Status: DC
  Filled 2021-10-24: qty 10

## 2021-10-24 MED ORDER — BUPIVACAINE HCL (PF) 0.5 % IJ SOLN
INTRAMUSCULAR | Status: AC
  Filled 2021-10-24: qty 10

## 2021-10-24 MED ORDER — METHOCARBAMOL 1000 MG/10ML IJ SOLN: 500.0000 mg | Freq: Four times a day (QID) | INTRAVENOUS | Status: DC | PRN

## 2021-10-24 MED ORDER — ONDANSETRON HCL 4 MG/2ML IJ SOLN: 4.0000 mg | Freq: Once | INTRAMUSCULAR | Status: DC | PRN

## 2021-10-24 MED ORDER — ONDANSETRON HCL 4 MG/2ML IJ SOLN
4.0000 mg | Freq: Four times a day (QID) | INTRAMUSCULAR | Status: DC | PRN
  Administered 2021-10-24 – 2021-10-25 (×2): 4 mg via INTRAVENOUS
  Filled 2021-10-24 (×2): qty 2

## 2021-10-24 MED ORDER — DOCUSATE SODIUM 100 MG PO CAPS
100.0000 mg | ORAL_CAPSULE | Freq: Two times a day (BID) | ORAL | Status: DC
  Administered 2021-10-24 – 2021-10-27 (×6): 100 mg via ORAL
  Filled 2021-10-24 (×6): qty 1

## 2021-10-24 MED ORDER — POLYETHYLENE GLYCOL 3350 17 G PO PACK
17.0000 g | PACK | Freq: Every day | ORAL | Status: DC | PRN
  Administered 2021-10-27: 17 g via ORAL
  Filled 2021-10-24: qty 1

## 2021-10-24 SURGICAL SUPPLY — 59 items
APL SKNCLS STERI-STRIP NONHPOA (GAUZE/BANDAGES/DRESSINGS)
BAG COUNTER SPONGE SURGICOUNT (BAG) ×3 IMPLANT
BAG SPNG CNTER NS LX DISP (BAG) ×1
BENZOIN TINCTURE PRP APPL 2/3 (GAUZE/BANDAGES/DRESSINGS) ×2 IMPLANT
BLADE CLIPPER SURG (BLADE) IMPLANT
BLADE SAW SGTL 18X1.27X75 (BLADE) ×3 IMPLANT
COVER PERINEAL POST (MISCELLANEOUS) ×3 IMPLANT
COVER SURGICAL LIGHT HANDLE (MISCELLANEOUS) ×4 IMPLANT
DRAPE C-ARM 42X72 X-RAY (DRAPES) ×3 IMPLANT
DRAPE IMP U-DRAPE 54X76 (DRAPES) ×3 IMPLANT
DRAPE STERI IOBAN 125X83 (DRAPES) ×3 IMPLANT
DRAPE U-SHAPE 47X51 STRL (DRAPES) ×8 IMPLANT
DRSG MEPILEX BORDER 4X12 (GAUZE/BANDAGES/DRESSINGS) ×1 IMPLANT
DRSG MEPILEX BORDER 4X8 (GAUZE/BANDAGES/DRESSINGS) ×2 IMPLANT
DURAPREP 26ML APPLICATOR (WOUND CARE) ×3 IMPLANT
ELECT BLADE 4.0 EZ CLEAN MEGAD (MISCELLANEOUS)
ELECT CAUTERY BLADE 6.4 (BLADE) ×3 IMPLANT
ELECT REM PT RETURN 9FT ADLT (ELECTROSURGICAL) ×2
ELECTRODE BLDE 4.0 EZ CLN MEGD (MISCELLANEOUS) IMPLANT
ELECTRODE REM PT RTRN 9FT ADLT (ELECTROSURGICAL) ×2 IMPLANT
ELIMINATOR HOLE APEX DEPUY (Hips) ×1 IMPLANT
FACESHIELD WRAPAROUND (MASK) ×4 IMPLANT
FACESHIELD WRAPAROUND OR TEAM (MASK) ×4 IMPLANT
GLOVE SRG 8 PF TXTR STRL LF DI (GLOVE) ×4 IMPLANT
GLOVE SURG ORTHO LTX SZ7.5 (GLOVE) ×6 IMPLANT
GLOVE SURG UNDER POLY LF SZ8 (GLOVE) ×4
GOWN STRL REUS W/ TWL LRG LVL3 (GOWN DISPOSABLE) ×2 IMPLANT
GOWN STRL REUS W/ TWL XL LVL3 (GOWN DISPOSABLE) ×2 IMPLANT
GOWN STRL REUS W/TWL 2XL LVL3 (GOWN DISPOSABLE) ×3 IMPLANT
GOWN STRL REUS W/TWL LRG LVL3 (GOWN DISPOSABLE) ×2
GOWN STRL REUS W/TWL XL LVL3 (GOWN DISPOSABLE) ×2
HEAD M SROM 36MM PLUS 1.5 (Hips) IMPLANT
KIT BASIN OR (CUSTOM PROCEDURE TRAY) ×3 IMPLANT
KIT TURNOVER KIT B (KITS) ×3 IMPLANT
LINER ACETAB NEUTRAL 36ID 520D (Liner) ×1 IMPLANT
MANIFOLD NEPTUNE II (INSTRUMENTS) ×3 IMPLANT
NDL HYPO 21X1 ECLIPSE (NEEDLE) ×2 IMPLANT
NEEDLE HYPO 21X1 ECLIPSE (NEEDLE) ×2 IMPLANT
NS IRRIG 1000ML POUR BTL (IV SOLUTION) ×3 IMPLANT
PACK TOTAL JOINT (CUSTOM PROCEDURE TRAY) ×3 IMPLANT
PAD ARMBOARD 7.5X6 YLW CONV (MISCELLANEOUS) ×6 IMPLANT
PIN SECTOR W/GRIP ACE CUP 52MM (Hips) ×1 IMPLANT
SCREW 6.5MMX25MM (Screw) ×1 IMPLANT
SROM M HEAD 36MM PLUS 1.5 (Hips) ×2 IMPLANT
STAPLER VISISTAT 35W (STAPLE) ×1 IMPLANT
STEM FEMORAL SZ 6MM STD ACTIS (Stem) ×1 IMPLANT
STRIP CLOSURE SKIN 1/2X4 (GAUZE/BANDAGES/DRESSINGS) ×2 IMPLANT
SUT VIC AB 0 CT1 27 (SUTURE) ×2
SUT VIC AB 0 CT1 27XBRD ANBCTR (SUTURE) ×2 IMPLANT
SUT VIC AB 2-0 CT1 27 (SUTURE) ×2
SUT VIC AB 2-0 CT1 TAPERPNT 27 (SUTURE) ×2 IMPLANT
SUT VICRYL 4-0 PS2 18IN ABS (SUTURE) ×3 IMPLANT
SUT VLOC 180 0 24IN GS25 (SUTURE) ×3 IMPLANT
SYR 20CC LL (SYRINGE) ×3 IMPLANT
TOWEL GREEN STERILE (TOWEL DISPOSABLE) ×6 IMPLANT
TOWEL GREEN STERILE FF (TOWEL DISPOSABLE) ×3 IMPLANT
TRAY CATH 16FR W/PLASTIC CATH (SET/KITS/TRAYS/PACK) IMPLANT
TRAY FOLEY MTR SLVR 16FR STAT (SET/KITS/TRAYS/PACK) IMPLANT
WATER STERILE IRR 1000ML POUR (IV SOLUTION) ×4 IMPLANT
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
## 2021-10-24 NOTE — Anesthesia Procedure Notes (Signed)
Spinal ? ?Patient location during procedure: OR ?Reason for block: surgical anesthesia ?Staffing ?Performed: anesthesiologist  ?Anesthesiologist: Lidia Collum, MD ?Preanesthetic Checklist ?Completed: patient identified, IV checked, risks and benefits discussed, surgical consent, monitors and equipment checked, pre-op evaluation and timeout performed ?Spinal Block ?Patient position: sitting ?Prep: DuraPrep and site prepped and draped ?Patient monitoring: continuous pulse ox, blood pressure and heart rate ?Approach: midline ?Location: L3-4 ?Injection technique: single-shot ?Needle ?Needle type: Pencan  ?Needle gauge: 24 G ?Needle length: 10 cm ?Assessment ?Events: CSF return ?Additional Notes ?Functioning IV was confirmed and monitors were applied. Sterile prep and drape, including hand hygiene and sterile gloves were used. The patient was positioned and the spine was prepped. The skin was anesthetized with lidocaine.  Free flow of clear CSF was obtained prior to injecting local anesthetic into the CSF. The needle was carefully withdrawn. The patient tolerated the procedure well.  ? ? ? ?
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
## 2021-10-24 NOTE — Anesthesia Postprocedure Evaluation (Signed)
Anesthesia Post Note ? ?Patient: Christy Douglas ? ?Procedure(s) Performed: RIGHT TOTAL HIP ARTHROPLASTY ANTERIOR APPROACH (Right: Hip) ? ?  ? ?Patient location during evaluation: PACU ?Anesthesia Type: Spinal ?Level of consciousness: oriented and awake and alert ?Pain management: pain level controlled ?Vital Signs Assessment: post-procedure vital signs reviewed and stable ?Respiratory status: spontaneous breathing, respiratory function stable and nonlabored ventilation ?Cardiovascular status: blood pressure returned to baseline and stable ?Postop Assessment: no headache, no backache, no apparent nausea or vomiting and spinal receding ?Anesthetic complications: no ? ? ?No notable events documented. ? ?Last Vitals:  ?Vitals:  ? 10/24/21 1000 10/24/21 1015  ?BP: (!) 102/45 (!) 105/59  ?Pulse: 77 79  ?Resp: (!) 9 13  ?Temp: (!) 36.4 ?C   ?SpO2: 97% 99%  ?  ?Last Pain:  ?Vitals:  ? 10/24/21 1000  ?TempSrc:   ?PainSc: 10-Worst pain ever  ? ? ?  ?  ?  ?  ?  ?  ? ?Lidia Collum ? ? ? ? ?

## 2021-10-24 NOTE — Discharge Instructions (Addendum)
INSTRUCTIONS AFTER TOTAL HIP JOINT REPLACEMENT  ? ?Remove items at home which could result in a fall. This includes throw rugs or furniture in walking pathways ?ICE to the affected joint every three hours while awake for 30 minutes at a time, for at least the first 3-5 days, and then as needed for pain and swelling.  Continue to use ice for pain and swelling. You may notice swelling that will progress down to the foot and ankle.  This is normal after surgery.  Elevate your leg when you are not up walking on it.   ?Continue to use the breathing machine you got in the hospital (incentive spirometer) which will help keep your temperature down.  It is common for your temperature to cycle up and down following surgery, especially at night when you are not up moving around and exerting yourself.  The breathing machine keeps your lungs expanded and your temperature down. ? ? ?DIET:  As you were doing prior to hospitalization, we recommend a well-balanced diet. ? ?DRESSING / WOUND CARE / SHOWERING ? ?You may change your dressing 3-5 days after surgery.  Then change the dressing every day with sterile gauze.  Please use good hand washing techniques before changing the dressing.  Do not use any lotions or creams on the incision until instructed by your surgeon. ? ?ACTIVITY ? ?Increase activity slowly as tolerated, but follow the weight bearing instructions below.   ?No driving for 6 weeks or until further direction given by your physician.  You cannot drive while taking narcotics.  ?No lifting or carrying greater than 10 lbs. until further directed by your surgeon. ?Avoid periods of inactivity such as sitting longer than an hour when not asleep. This helps prevent blood clots.  ?You may return to work once you are authorized by your doctor.  ? ? ? ?WEIGHT BEARING  ? ?Weight bearing as tolerated with assist device (walker, cane, etc) as directed, use it as long as suggested by your surgeon or therapist, typically at least 4-6  weeks. ? ? ?EXERCISES ? ?Results after joint replacement surgery are often greatly improved when you follow the exercise, range of motion and muscle strengthening exercises prescribed by your doctor. Safety measures are also important to protect the joint from further injury. Any time any of these exercises cause you to have increased pain or swelling, decrease what you are doing until you are comfortable again and then slowly increase them. If you have problems or questions, call your caregiver or physical therapist for advice.  ? ?Rehabilitation is important following a joint replacement. After just a few days of immobilization, the muscles of the leg can become weakened and shrink (atrophy).  These exercises are designed to build up the tone and strength of the thigh and leg muscles and to improve motion. Often times heat used for twenty to thirty minutes before working out will loosen up your tissues and help with improving the range of motion but do not use heat for the first two weeks following surgery (sometimes heat can increase post-operative swelling).  ? ? ?A rehabilitation program following joint replacement surgery can speed recovery and prevent re-injury in the future due to weakened muscles. Contact your doctor or a physical therapist for more information on knee rehabilitation.  ? ? ?CONSTIPATION ? ?Constipation is defined medically as fewer than three stools per week and severe constipation as less than one stool per week.  Even if you have a regular bowel pattern at home, your  normal regimen is likely to be disrupted due to multiple reasons following surgery.  Combination of anesthesia, postoperative narcotics, change in appetite and fluid intake all can affect your bowels.  ? ?YOU MUST use at least one of the following options; they are listed in order of increasing strength to get the job done.  They are all available over the counter, and you may need to use some, POSSIBLY even all of these  options:   ? ?Drink plenty of fluids (prune juice may be helpful) and high fiber foods ?Colace 100 mg by mouth twice a day  ?Senokot for constipation as directed and as needed Dulcolax (bisacodyl), take with full glass of water  ?Miralax (polyethylene glycol) once or twice a day as needed. ? ?If you have tried all these things and are unable to have a bowel movement in the first 3-4 days after surgery call either your surgeon or your primary doctor.   ? ?If you experience loose stools or diarrhea, hold the medications until you stool forms back up.  If your symptoms do not get better within 1 week or if they get worse, check with your doctor.  If you experience "the worst abdominal pain ever" or develop nausea or vomiting, please contact the office immediately for further recommendations for treatment. ? ? ?ITCHING:  If you experience itching with your medications, try taking only a single pain pill, or even half a pain pill at a time.  You can also use Benadryl over the counter for itching or also to help with sleep.  ? ?TED HOSE STOCKINGS:  Use stockings on both legs until for at least 2 weeks or as directed by physician office. They may be removed at night for sleeping. ? ?MEDICATIONS:  See your medication summary on the ?After Visit Summary? that nursing will review with you.  You may have some home medications which will be placed on hold until you complete the course of blood thinner medication.  It is important for you to complete the blood thinner medication as prescribed. ? ?PRECAUTIONS:  If you experience chest pain or shortness of breath - call 911 immediately for transfer to the hospital emergency department.  ? ?If you develop a fever greater that 101 F, purulent drainage from wound, increased redness or drainage from wound, foul odor from the wound/dressing, or calf pain - CONTACT YOUR SURGEON.   ?                                                ?FOLLOW-UP APPOINTMENTS:  If you do not already have a  post-op appointment, please call the office for an appointment to be seen by your surgeon.  Guidelines for how soon to be seen are listed in your ?After Visit Summary?, but are typically between 1-4 weeks after surgery. ? ?OTHER INSTRUCTIONS:  ? ?Knee Replacement:  Do not place pillow under knee, focus on keeping the knee straight while resting. CPM instructions: 0-90 degrees, 2 hours in the morning, 2 hours in the afternoon, and 2 hours in the evening. Place foam block, curve side up under heel at all times except when in CPM or when walking.  DO NOT modify, tear, cut, or change the foam block in any way. ? ?POST-OPERATIVE OPIOID TAPER INSTRUCTIONS: ?It is important to wean off of your opioid medication as soon as possible.  If you do not need pain medication after your surgery it is ok to stop day one. ?Opioids include: ?Codeine, Hydrocodone(Norco, Vicodin), Oxycodone(Percocet, oxycontin) and hydromorphone amongst others.  ?Long term and even short term use of opiods can cause: ?Increased pain response ?Dependence ?Constipation ?Depression ?Respiratory depression ?And more.  ?Withdrawal symptoms can include ?Flu like symptoms ?Nausea, vomiting ?And more ?Techniques to manage these symptoms ?Hydrate well ?Eat regular healthy meals ?Stay active ?Use relaxation techniques(deep breathing, meditating, yoga) ?Do Not substitute Alcohol to help with tapering ?If you have been on opioids for less than two weeks and do not have pain than it is ok to stop all together.  ?Plan to wean off of opioids ?This plan should start within one week post op of your joint replacement. ?Maintain the same interval or time between taking each dose and first decrease the dose.  ?Cut the total daily intake of opioids by one tablet each day ?Next start to increase the time between doses. ?The last dose that should be eliminated is the evening dose.  ? ?MAKE SURE YOU:  ?Understand these instructions.  ?Get help right away if you are not doing  well or get worse.  ? ? ?Thank you for letting us be a part of your medical care team.  It is a privilege we respect greatly.  We hope these instructions will help you stay on track for a fast and full recover

## 2021-10-24 NOTE — Transfer of Care (Signed)
Immediate Anesthesia Transfer of Care Note ? ?Patient: Christy Douglas ? ?Procedure(s) Performed: RIGHT TOTAL HIP ARTHROPLASTY ANTERIOR APPROACH (Right: Hip) ? ?Patient Location: PACU ? ?Anesthesia Type:MAC ? ?Level of Consciousness: awake, alert , oriented, patient cooperative and responds to stimulation ? ?Airway & Oxygen Therapy: Patient Spontanous Breathing ? ?Post-op Assessment: Report given to RN and Post -op Vital signs reviewed and stable ? ?Post vital signs: Reviewed and stable ? ?Last Vitals:  ?Vitals Value Taken Time  ?BP 102/45 10/24/21 0959  ?Temp    ?Pulse 77 10/24/21 0959  ?Resp 9 10/24/21 0959  ?SpO2 97 % 10/24/21 0959  ?Vitals shown include unvalidated device data. ? ?Last Pain:  ?Vitals:  ? 10/24/21 0636  ?TempSrc:   ?PainSc: 6   ?   ? ?Patients Stated Pain Goal: 6 (10/24/21 0636) ? ?Complications: No notable events documented. ?

## 2021-10-24 NOTE — Interval H&P Note (Signed)
History and Physical Interval Note: ? ?10/24/2021 ?7:24 AM ? ?Christy Douglas  has presented today for surgery, with the diagnosis of right hip osteoarthritis.  The various methods of treatment have been discussed with the patient and family. After consideration of risks, benefits and other options for treatment, the patient has consented to  Procedure(s): ?RIGHT TOTAL HIP ARTHROPLASTY ANTERIOR APPROACH (Right) as a surgical intervention.  The patient's history has been reviewed, patient examined, no change in status, stable for surgery.  I have reviewed the patient's chart and labs.  Questions were answered to the patient's satisfaction.   ? ? ?Marybelle Killings ? ? ?

## 2021-10-24 NOTE — Care Plan (Signed)
Ortho Bundle Case Management Note ? ?Patient Details  ?Name: Christy Douglas ?MRN: [id number] ?Date of Birth: [birth year] ? ?                ?[facility] RNCM call to patient prior to her Right total hip arthroplasty with Dr. Lorin Mercy on 10/24/21. She is an Ortho bundle patient through [facility] and is agreeable to case management. She lives alone, but has a mother who lives very close and will be able to come and stay with her after discharge. She will need a RW and 3in1/BSC. These will be ordered through [place] to be delivered to her room prior to discharge. Anticipate she will need HHPT after short hospital stay. Dr. Lorin Mercy is agreeable to HHPT and referral sent to [facility]. Choice provided and patient accepted. Reviewed all post op care instructions. Will continue to follow for needs. ? ? ?DME Arranged:  3-N-1, Walker rolling ?DME Agency:  [facility] ? ?HH Arranged:  PT ?[place] Agency:  [place] ? ?Additional Comments: ?Please contact me with any questions of if this plan should need to change. ? ?Jamse Arn, RN, BSN, [employer]  [phone] ?10/24/2021, 1:51 PM ?  ?

## 2021-10-25 DIAGNOSIS — E039 Hypothyroidism, unspecified: Secondary | ICD-10-CM | POA: Diagnosis not present

## 2021-10-25 DIAGNOSIS — Z79899 Other long term (current) drug therapy: Secondary | ICD-10-CM | POA: Diagnosis not present

## 2021-10-25 DIAGNOSIS — M1611 Unilateral primary osteoarthritis, right hip: Secondary | ICD-10-CM | POA: Diagnosis not present

## 2021-10-25 LAB — CBC
HCT: 26.9 % — ABNORMAL LOW (ref 36.0–46.0)
Hemoglobin: 9.2 g/dL — ABNORMAL LOW (ref 12.0–15.0)
MCH: 31.2 pg (ref 26.0–34.0)
MCHC: 34.2 g/dL (ref 30.0–36.0)
MCV: 91.2 fL (ref 80.0–100.0)
Platelets: 155 10*3/uL (ref 150–400)
RBC: 2.95 MIL/uL — ABNORMAL LOW (ref 3.87–5.11)
RDW: 13.2 % (ref 11.5–15.5)
WBC: 7.8 10*3/uL (ref 4.0–10.5)
nRBC: 0 % (ref 0.0–0.2)

## 2021-10-25 LAB — BASIC METABOLIC PANEL
Anion gap: 5 (ref 5–15)
BUN: 6 mg/dL — ABNORMAL LOW (ref 8–23)
CO2: 23 mmol/L (ref 22–32)
Calcium: 9.4 mg/dL (ref 8.9–10.3)
Chloride: 100 mmol/L (ref 98–111)
Creatinine, Ser: 0.5 mg/dL (ref 0.44–1.00)
GFR, Estimated: >60 mL/min (ref >60)
Glucose, Bld: 135 mg/dL — ABNORMAL HIGH (ref 70–99)
Potassium: 3.8 mmol/L (ref 3.5–5.1)
Sodium: 128 mmol/L — ABNORMAL LOW (ref 135–145)

## 2021-10-25 NOTE — TOC Progression Note (Addendum)
Transition of Care (TOC) - Progression Note  ? ? ?Patient Details  ?Name: Christy Douglas ?MRN: [id number] ?Date of Birth: [birth year] ? ?Transition of Care (TOC) CM/SW Contact  ?Bartholomew Crews, RN ?Phone Number: [phone] ?10/25/2021, 9:35 AM ? ?Clinical Narrative:    ? ?Patient is orthobundle. HH arranged with [facility] by outpatient provider. Confirmed following with [facility] weekend liaison, Alwyn Ren. Per notes, [facility] to deliver RW and 3/1 to bedside.  ? ?Update 2:40pm: Confirmed delivery of DME to bedside.  ? ?Expected Discharge Plan: [place] ?  ? ?Expected Discharge Plan and Services ?Expected Discharge Plan: [place] ?  ?  ?  ?  ?                ?DME Arranged: 3-N-1, Walker rolling ?DME Agency: [facility] ?  ?  ?  ?HH Arranged: PT ?[place] Agency: [place] ?  ?  ?  ? ? ?Social Determinants of Health (SDOH) Interventions ?  ? ?Readmission Risk Interventions ?   ? View : No data to display.  ?  ?  ?  ? ? ?

## 2021-10-25 NOTE — Care Management Obs Status (Signed)
MEDICARE OBSERVATION STATUS NOTIFICATION ? ? ?Patient Details  ?Name: Christy Douglas ?MRN: [id number] ?Date of Birth: Dec 14, 1955 ? ? ?Medicare Observation Status Notification Given:  Yes ? ? ? ?Bartholomew Crews, RN ?10/25/2021, 2:44 PM ?
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
## 2021-10-25 NOTE — Progress Notes (Signed)
Subjective: ?1 Day Post-Op Procedure(s) (LRB): ?RIGHT TOTAL HIP ARTHROPLASTY ANTERIOR APPROACH (Right) ?Patient reports pain as moderate.  Acute blood loss anemia from surgery.  Vitals stable thus far.  Reports low back pain. ? ?Objective: ?Vital signs in last 24 hours: ?Temp:  [97.5 ?F (36.4 ?C)-98.4 ?F (36.9 ?C)] 98 ?F (36.7 ?C) (03/25 7619) ?Pulse Rate:  [57-105] 105 (03/25 0758) ?Resp:  [8-21] 16 (03/25 0758) ?BP: (99-133)/(45-93) 117/54 (03/25 0758) ?SpO2:  [92 %-100 %] 97 % (03/25 0758) ? ?Intake/Output from previous day: ?03/24 0701 - 03/25 0700 ?In: 350 [IV Piggyback:350] ?Out: 2785 [Urine:2375; Emesis/NG output:10; Blood:400] ?Intake/Output this shift: ?No intake/output data recorded. ? ?Recent Labs  ?  10/25/21 ?0210  ?HGB 9.2*  ? ?Recent Labs  ?  10/25/21 ?0210  ?WBC 7.8  ?RBC 2.95*  ?HCT 26.9*  ?PLT 155  ? ?Recent Labs  ?  10/25/21 ?0210  ?NA 128*  ?K 3.8  ?CL 100  ?CO2 23  ?BUN 6*  ?CREATININE 0.50  ?GLUCOSE 135*  ?CALCIUM 9.4  ? ?No results for input(s): LABPT, INR in the last 72 hours. ? ?Sensation intact distally ?Intact pulses distally ?Dorsiflexion/Plantar flexion intact ?Incision: dressing C/D/I ? ? ?Assessment/Plan: ?1 Day Post-Op Procedure(s) (LRB): ?RIGHT TOTAL HIP ARTHROPLASTY ANTERIOR APPROACH (Right) ?Up with therapy ? ? ? ? ? ?Mcarthur Rossetti ?10/25/2021, 8:15 AM ? ?

## 2021-10-26 DIAGNOSIS — M1611 Unilateral primary osteoarthritis, right hip: Secondary | ICD-10-CM | POA: Diagnosis not present

## 2021-10-26 DIAGNOSIS — Z79899 Other long term (current) drug therapy: Secondary | ICD-10-CM | POA: Diagnosis not present

## 2021-10-26 DIAGNOSIS — E039 Hypothyroidism, unspecified: Secondary | ICD-10-CM | POA: Diagnosis not present

## 2021-10-26 LAB — CBC
HCT: 26.1 % — ABNORMAL LOW (ref 36.0–46.0)
Hemoglobin: 9.1 g/dL — ABNORMAL LOW (ref 12.0–15.0)
MCH: 31.7 pg (ref 26.0–34.0)
MCHC: 34.9 g/dL (ref 30.0–36.0)
MCV: 90.9 fL (ref 80.0–100.0)
Platelets: 149 10*3/uL — ABNORMAL LOW (ref 150–400)
RBC: 2.87 MIL/uL — ABNORMAL LOW (ref 3.87–5.11)
RDW: 13.6 % (ref 11.5–15.5)
WBC: 10.7 10*3/uL — ABNORMAL HIGH (ref 4.0–10.5)
nRBC: 0 % (ref 0.0–0.2)
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
## 2021-10-26 NOTE — Progress Notes (Signed)
Patient ID: Christy Douglas, female   DOB: 1956/01/11, 66 y.o.   MRN: [id number] ?The patient is resting comfortably in bed this morning.  I did speak with her about how she is doing overall.  She is only slightly tachycardic but her vital signs are stable.  She denies any lightheadedness.  Her hemoglobin is 9.1.  This is stable.  She did work with therapy yesterday morning.  Her afternoon session was limited secondary to her pain.  She still needs to work on stair training and increasing her mobility.  She would like to stay 1 more day to maximize therapy in the hospital I think this is reasonable as well. ?

## 2021-10-27 ENCOUNTER — Encounter (HOSPITAL_COMMUNITY): Payer: Self-pay | Admitting: Orthopaedic Surgery

## 2021-10-27 DIAGNOSIS — Z79899 Other long term (current) drug therapy: Secondary | ICD-10-CM | POA: Diagnosis not present

## 2021-10-27 DIAGNOSIS — M1611 Unilateral primary osteoarthritis, right hip: Secondary | ICD-10-CM | POA: Diagnosis not present

## 2021-10-27 DIAGNOSIS — E039 Hypothyroidism, unspecified: Secondary | ICD-10-CM | POA: Diagnosis not present

## 2021-10-27 NOTE — Progress Notes (Signed)
Patient ID: Rihana Kiddy, female   DOB: 21-Aug-1955, 66 y.o.   MRN: [id number] ? ? ?Subjective: ?3 Days Post-Op Procedure(s) (LRB): ?RIGHT TOTAL HIP ARTHROPLASTY ANTERIOR APPROACH (Right) ?Patient reports pain as mild.   ? ?Objective: ?Vital signs in last 24 hours: ?Temp:  [97.9 ?F (36.6 ?C)-98.4 ?F (36.9 ?C)] 98.4 ?F (36.9 ?C) (03/27 0747) ?Pulse Rate:  [90-111] 90 (03/27 0747) ?Resp:  [16-18] 17 (03/27 0747) ?BP: (107-122)/(41-59) 118/57 (03/27 0747) ?SpO2:  [96 %-100 %] 100 % (03/27 0747) ? ?Intake/Output from previous day: ?03/26 0701 - 03/27 0700 ?In: 940 [P.O.:840] ?Out: -  ?Intake/Output this shift: ?Total I/O ?In: 240 [P.O.:240] ?Out: -  ? ?Recent Labs  ?  10/25/21 ?0210 10/26/21 ?0400  ?HGB 9.2* 9.1*  ? ?Recent Labs  ?  10/25/21 ?0210 10/26/21 ?0400  ?WBC 7.8 10.7*  ?RBC 2.95* 2.87*  ?HCT 26.9* 26.1*  ?PLT 155 149*  ? ?Recent Labs  ?  10/25/21 ?0210  ?NA 128*  ?K 3.8  ?CL 100  ?CO2 23  ?BUN 6*  ?CREATININE 0.50  ?GLUCOSE 135*  ?CALCIUM 9.4  ? ?No results for input(s): LABPT, INR in the last 72 hours. ? ?Neurologically intact ?No results found. ? ?Assessment/Plan: ?3 Days Post-Op Procedure(s) (LRB): ?RIGHT TOTAL HIP ARTHROPLASTY ANTERIOR APPROACH (Right) ?Up with therapy, discharge home, office one week.  ? ?Marybelle Killings ?10/27/2021, 9:13 AM ? ? ? ? ? ?

## 2021-10-27 NOTE — TOC Transition Note (Signed)
Transition of Care (TOC) - CM/SW Discharge Note ? ? ?Patient Details  ?Name: Christy Douglas ?MRN: [id number] ?Date of Birth: 04/15/1956 ? ?Transition of Care (TOC) CM/SW Contact:  ?Bartholomew Crews, RN ?Phone Number: [phone] ?10/27/2021, 9:40 AM ? ? ?Clinical Narrative:    ? ?Patient to transition home today. Notified liaison with [facility]. No further TOC needs identified.  ? ?Final next level of care: [place] ?Barriers to Discharge: No Barriers Identified ? ? ?Patient Goals and CMS Choice ?Patient states their goals for this hospitalization and ongoing recovery are:: home ?CMS Medicare.gov Compare Post Acute Care list provided to:: Patient ?  ? ?Discharge Placement ?  ?           ?  ?  ?  ?  ? ?Discharge Plan and Services ?  ?  ?           ?DME Arranged: 3-N-1, Walker rolling ?DME Agency: [employer] ?  ?  ?  ?HH Arranged: PT ?[place] Agency: [facility] [facility] ?Date HH Agency Contacted: 10/27/21 ?Time [place]: 0940 ?Representative spoke with at [place]: Marjory Lies ? ?Social Determinants of Health (SDOH) Interventions ?  ? ? ?Readmission Risk Interventions ?   ? View : No data to display.  ?  ?  ?  ? ? ? ? ? ?

## 2021-10-27 NOTE — Progress Notes (Signed)
Mobility Specialist: Progress Note ? ? 10/27/21 1233  ?Mobility  ?Activity Ambulated with assistance in hallway  ?Level of Assistance Contact guard assist, steadying assist  ?Assistive Device Front wheel walker  ?RLE Weight Bearing WBAT  ?Distance Ambulated (ft) 270 ft  ?Activity Response Tolerated well  ?$Mobility charge 1 Mobility  ? ?Pt received in bed and agreeable to ambulation. Able to independently don RLE AFO and L shoe. C/o 10/10 pain in her R hip, otherwise asymptomatic throughout. Pt back to bed after session with call bell and phone at her side.  ? ?Christy Douglas ?Mobility Specialist ?Mobility Specialist [place]: [phone] ?Mobility Specialist [place]: [phone] ? ?
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
## 2021-10-28 ENCOUNTER — Telehealth: Payer: Self-pay | Admitting: *Deleted

## 2021-10-28 DIAGNOSIS — M4316 Spondylolisthesis, lumbar region: Secondary | ICD-10-CM | POA: Diagnosis not present

## 2021-10-28 DIAGNOSIS — M21371 Foot drop, right foot: Secondary | ICD-10-CM | POA: Diagnosis not present

## 2021-10-28 DIAGNOSIS — J309 Allergic rhinitis, unspecified: Secondary | ICD-10-CM | POA: Diagnosis not present

## 2021-10-28 DIAGNOSIS — Z471 Aftercare following joint replacement surgery: Secondary | ICD-10-CM | POA: Diagnosis not present

## 2021-10-28 DIAGNOSIS — K5900 Constipation, unspecified: Secondary | ICD-10-CM | POA: Diagnosis not present

## 2021-10-28 DIAGNOSIS — E039 Hypothyroidism, unspecified: Secondary | ICD-10-CM | POA: Diagnosis not present

## 2021-10-28 DIAGNOSIS — Z96641 Presence of right artificial hip joint: Secondary | ICD-10-CM | POA: Diagnosis not present

## 2021-10-28 DIAGNOSIS — M810 Age-related osteoporosis without current pathological fracture: Secondary | ICD-10-CM | POA: Diagnosis not present

## 2021-10-28 DIAGNOSIS — E210 Primary hyperparathyroidism: Secondary | ICD-10-CM | POA: Diagnosis not present

## 2021-10-28 DIAGNOSIS — Z9181 History of falling: Secondary | ICD-10-CM | POA: Diagnosis not present

## 2021-10-28 DIAGNOSIS — Z7982 Long term (current) use of aspirin: Secondary | ICD-10-CM | POA: Diagnosis not present

## 2021-10-28 NOTE — Telephone Encounter (Signed)
Ortho bundle D/C call completed. 

## 2021-10-30 DIAGNOSIS — E210 Primary hyperparathyroidism: Secondary | ICD-10-CM | POA: Diagnosis not present

## 2021-10-30 DIAGNOSIS — Z9181 History of falling: Secondary | ICD-10-CM | POA: Diagnosis not present

## 2021-10-30 DIAGNOSIS — Z7982 Long term (current) use of aspirin: Secondary | ICD-10-CM | POA: Diagnosis not present

## 2021-10-30 DIAGNOSIS — M21371 Foot drop, right foot: Secondary | ICD-10-CM | POA: Diagnosis not present

## 2021-10-30 DIAGNOSIS — J309 Allergic rhinitis, unspecified: Secondary | ICD-10-CM | POA: Diagnosis not present

## 2021-10-30 DIAGNOSIS — Z96641 Presence of right artificial hip joint: Secondary | ICD-10-CM | POA: Diagnosis not present

## 2021-10-30 DIAGNOSIS — E039 Hypothyroidism, unspecified: Secondary | ICD-10-CM | POA: Diagnosis not present

## 2021-10-30 DIAGNOSIS — Z471 Aftercare following joint replacement surgery: Secondary | ICD-10-CM | POA: Diagnosis not present

## 2021-10-30 DIAGNOSIS — K5900 Constipation, unspecified: Secondary | ICD-10-CM | POA: Diagnosis not present

## 2021-10-30 DIAGNOSIS — M810 Age-related osteoporosis without current pathological fracture: Secondary | ICD-10-CM | POA: Diagnosis not present

## 2021-10-30 DIAGNOSIS — M4316 Spondylolisthesis, lumbar region: Secondary | ICD-10-CM | POA: Diagnosis not present

## 2021-10-31 ENCOUNTER — Telehealth: Payer: Self-pay | Admitting: *Deleted

## 2021-10-31 ENCOUNTER — Encounter: Payer: Self-pay | Admitting: Orthopaedic Surgery

## 2021-10-31 ENCOUNTER — Ambulatory Visit (INDEPENDENT_AMBULATORY_CARE_PROVIDER_SITE_OTHER): Payer: Medicare Other | Admitting: Orthopaedic Surgery

## 2021-10-31 ENCOUNTER — Ambulatory Visit: Payer: Self-pay

## 2021-10-31 VITALS — BP 136/81 | HR 103 | Ht 63.0 in | Wt 148.0 lb

## 2021-10-31 DIAGNOSIS — Z96641 Presence of right artificial hip joint: Secondary | ICD-10-CM

## 2021-10-31 NOTE — Telephone Encounter (Signed)
7 day Ortho bundle call completed. 
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
## 2021-11-03 DIAGNOSIS — Z9181 History of falling: Secondary | ICD-10-CM | POA: Diagnosis not present

## 2021-11-03 DIAGNOSIS — Z96641 Presence of right artificial hip joint: Secondary | ICD-10-CM | POA: Diagnosis not present

## 2021-11-03 DIAGNOSIS — M810 Age-related osteoporosis without current pathological fracture: Secondary | ICD-10-CM | POA: Diagnosis not present

## 2021-11-03 DIAGNOSIS — Z471 Aftercare following joint replacement surgery: Secondary | ICD-10-CM | POA: Diagnosis not present

## 2021-11-03 DIAGNOSIS — M4316 Spondylolisthesis, lumbar region: Secondary | ICD-10-CM | POA: Diagnosis not present

## 2021-11-03 DIAGNOSIS — E210 Primary hyperparathyroidism: Secondary | ICD-10-CM | POA: Diagnosis not present

## 2021-11-03 DIAGNOSIS — Z7982 Long term (current) use of aspirin: Secondary | ICD-10-CM | POA: Diagnosis not present

## 2021-11-03 DIAGNOSIS — M21371 Foot drop, right foot: Secondary | ICD-10-CM | POA: Diagnosis not present

## 2021-11-03 DIAGNOSIS — E039 Hypothyroidism, unspecified: Secondary | ICD-10-CM | POA: Diagnosis not present

## 2021-11-03 DIAGNOSIS — K5900 Constipation, unspecified: Secondary | ICD-10-CM | POA: Diagnosis not present

## 2021-11-03 DIAGNOSIS — J309 Allergic rhinitis, unspecified: Secondary | ICD-10-CM | POA: Diagnosis not present
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
## 2021-11-06 ENCOUNTER — Telehealth: Payer: Self-pay | Admitting: *Deleted

## 2021-11-06 NOTE — Telephone Encounter (Signed)
Ortho bundle 14 day call completed. 

## 2021-11-11 ENCOUNTER — Encounter: Payer: Self-pay | Admitting: Orthopaedic Surgery

## 2021-11-11 ENCOUNTER — Ambulatory Visit (INDEPENDENT_AMBULATORY_CARE_PROVIDER_SITE_OTHER): Payer: Medicare Other | Admitting: Orthopaedic Surgery

## 2021-11-11 VITALS — BP 138/83 | HR 98 | Ht 63.0 in | Wt 148.0 lb

## 2021-11-11 DIAGNOSIS — Z96641 Presence of right artificial hip joint: Secondary | ICD-10-CM

## 2021-11-11 DIAGNOSIS — M1611 Unilateral primary osteoarthritis, right hip: Secondary | ICD-10-CM | POA: Diagnosis not present
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
## 2021-12-16 ENCOUNTER — Ambulatory Visit (INDEPENDENT_AMBULATORY_CARE_PROVIDER_SITE_OTHER): Payer: Medicare Other | Admitting: Orthopaedic Surgery

## 2021-12-16 ENCOUNTER — Encounter: Payer: Self-pay | Admitting: Orthopaedic Surgery

## 2021-12-16 VITALS — BP 100/63 | HR 64 | Ht 63.0 in | Wt 148.0 lb

## 2021-12-16 DIAGNOSIS — Z96641 Presence of right artificial hip joint: Secondary | ICD-10-CM
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
## 2021-12-17 ENCOUNTER — Telehealth: Payer: Self-pay | Admitting: *Deleted

## 2021-12-17 NOTE — Telephone Encounter (Signed)
Ortho bundle 30 day meeting and survey completed. ?

## 2022-01-14 DIAGNOSIS — M5136 Other intervertebral disc degeneration, lumbar region: Secondary | ICD-10-CM | POA: Diagnosis not present

## 2022-01-14 DIAGNOSIS — M9903 Segmental and somatic dysfunction of lumbar region: Secondary | ICD-10-CM | POA: Diagnosis not present

## 2022-01-14 DIAGNOSIS — M5134 Other intervertebral disc degeneration, thoracic region: Secondary | ICD-10-CM | POA: Diagnosis not present

## 2022-01-14 DIAGNOSIS — M9902 Segmental and somatic dysfunction of thoracic region: Secondary | ICD-10-CM | POA: Diagnosis not present

## 2022-01-23 ENCOUNTER — Telehealth: Payer: Self-pay | Admitting: Family Medicine

## 2022-01-23 NOTE — Telephone Encounter (Signed)
Pt Placard has been completed and mailed out to pt home address

## 2022-01-23 NOTE — Telephone Encounter (Signed)
Handicap Parking Placard to be filled out--placed in Dr's folder.  Please mail to patient's address on file upon completion.

## 2022-01-27 ENCOUNTER — Telehealth: Payer: Self-pay | Admitting: *Deleted

## 2022-01-27 NOTE — Telephone Encounter (Signed)
90 day Ortho bundle call and survey completed.

## 2022-02-04 DIAGNOSIS — H2513 Age-related nuclear cataract, bilateral: Secondary | ICD-10-CM | POA: Diagnosis not present

## 2022-02-04 DIAGNOSIS — H524 Presbyopia: Secondary | ICD-10-CM | POA: Diagnosis not present

## 2022-02-04 DIAGNOSIS — H4720 Unspecified optic atrophy: Secondary | ICD-10-CM | POA: Diagnosis not present

## 2022-02-04 DIAGNOSIS — H04123 Dry eye syndrome of bilateral lacrimal glands: Secondary | ICD-10-CM | POA: Diagnosis not present

## 2022-02-17 DIAGNOSIS — M9903 Segmental and somatic dysfunction of lumbar region: Secondary | ICD-10-CM | POA: Diagnosis not present

## 2022-02-17 DIAGNOSIS — M5134 Other intervertebral disc degeneration, thoracic region: Secondary | ICD-10-CM | POA: Diagnosis not present

## 2022-02-17 DIAGNOSIS — M5136 Other intervertebral disc degeneration, lumbar region: Secondary | ICD-10-CM | POA: Diagnosis not present

## 2022-02-17 DIAGNOSIS — M9902 Segmental and somatic dysfunction of thoracic region: Secondary | ICD-10-CM | POA: Diagnosis not present

## 2022-02-24 ENCOUNTER — Telehealth: Payer: Self-pay | Admitting: Orthopaedic Surgery

## 2022-02-24 NOTE — Telephone Encounter (Signed)
[place] per patient request spoke with Christy Douglas) advised per Dr Lorin Mercy patient do not need an antibiotic prior to having her teeth cleaned  Called patient and advised I contacted the dental office     [phone]

## 2022-03-16 DIAGNOSIS — M9904 Segmental and somatic dysfunction of sacral region: Secondary | ICD-10-CM | POA: Diagnosis not present

## 2022-03-16 DIAGNOSIS — M9903 Segmental and somatic dysfunction of lumbar region: Secondary | ICD-10-CM | POA: Diagnosis not present

## 2022-03-16 DIAGNOSIS — M9905 Segmental and somatic dysfunction of pelvic region: Secondary | ICD-10-CM | POA: Diagnosis not present

## 2022-03-16 DIAGNOSIS — M5136 Other intervertebral disc degeneration, lumbar region: Secondary | ICD-10-CM | POA: Diagnosis not present

## 2022-04-01 DIAGNOSIS — M9904 Segmental and somatic dysfunction of sacral region: Secondary | ICD-10-CM | POA: Diagnosis not present

## 2022-04-01 DIAGNOSIS — M9905 Segmental and somatic dysfunction of pelvic region: Secondary | ICD-10-CM | POA: Diagnosis not present

## 2022-04-01 DIAGNOSIS — M5136 Other intervertebral disc degeneration, lumbar region: Secondary | ICD-10-CM | POA: Diagnosis not present

## 2022-04-01 DIAGNOSIS — M9903 Segmental and somatic dysfunction of lumbar region: Secondary | ICD-10-CM | POA: Diagnosis not present

## 2022-04-08 DIAGNOSIS — M5136 Other intervertebral disc degeneration, lumbar region: Secondary | ICD-10-CM | POA: Diagnosis not present

## 2022-04-08 DIAGNOSIS — M9904 Segmental and somatic dysfunction of sacral region: Secondary | ICD-10-CM | POA: Diagnosis not present

## 2022-04-08 DIAGNOSIS — M9905 Segmental and somatic dysfunction of pelvic region: Secondary | ICD-10-CM | POA: Diagnosis not present

## 2022-04-08 DIAGNOSIS — M9903 Segmental and somatic dysfunction of lumbar region: Secondary | ICD-10-CM | POA: Diagnosis not present

## 2022-04-14 ENCOUNTER — Other Ambulatory Visit: Payer: Self-pay | Admitting: Family Medicine

## 2022-04-22 DIAGNOSIS — M816 Localized osteoporosis [Lequesne]: Secondary | ICD-10-CM | POA: Diagnosis not present

## 2022-04-22 DIAGNOSIS — Z1231 Encounter for screening mammogram for malignant neoplasm of breast: Secondary | ICD-10-CM | POA: Diagnosis not present

## 2022-04-22 LAB — HM MAMMOGRAPHY

## 2022-04-22 LAB — HM DEXA SCAN

## 2022-05-05 DIAGNOSIS — M9905 Segmental and somatic dysfunction of pelvic region: Secondary | ICD-10-CM | POA: Diagnosis not present

## 2022-05-05 DIAGNOSIS — M9903 Segmental and somatic dysfunction of lumbar region: Secondary | ICD-10-CM | POA: Diagnosis not present

## 2022-05-05 DIAGNOSIS — M5136 Other intervertebral disc degeneration, lumbar region: Secondary | ICD-10-CM | POA: Diagnosis not present

## 2022-05-05 DIAGNOSIS — M9904 Segmental and somatic dysfunction of sacral region: Secondary | ICD-10-CM | POA: Diagnosis not present

## 2022-05-06 ENCOUNTER — Encounter: Payer: Self-pay | Admitting: Surgery

## 2022-05-06 ENCOUNTER — Ambulatory Visit: Payer: Medicare Other | Admitting: Surgery

## 2022-05-06 ENCOUNTER — Ambulatory Visit: Payer: Self-pay

## 2022-05-06 DIAGNOSIS — S92411A Displaced fracture of proximal phalanx of right great toe, initial encounter for closed fracture: Secondary | ICD-10-CM | POA: Diagnosis not present

## 2022-05-06 DIAGNOSIS — M79674 Pain in right toe(s): Secondary | ICD-10-CM
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
## 2022-05-12 DIAGNOSIS — M9904 Segmental and somatic dysfunction of sacral region: Secondary | ICD-10-CM | POA: Diagnosis not present

## 2022-05-12 DIAGNOSIS — M5136 Other intervertebral disc degeneration, lumbar region: Secondary | ICD-10-CM | POA: Diagnosis not present

## 2022-05-12 DIAGNOSIS — M9905 Segmental and somatic dysfunction of pelvic region: Secondary | ICD-10-CM | POA: Diagnosis not present

## 2022-05-12 DIAGNOSIS — M9903 Segmental and somatic dysfunction of lumbar region: Secondary | ICD-10-CM | POA: Diagnosis not present

## 2022-05-20 DIAGNOSIS — M9904 Segmental and somatic dysfunction of sacral region: Secondary | ICD-10-CM | POA: Diagnosis not present

## 2022-05-20 DIAGNOSIS — M9903 Segmental and somatic dysfunction of lumbar region: Secondary | ICD-10-CM | POA: Diagnosis not present

## 2022-05-20 DIAGNOSIS — M5136 Other intervertebral disc degeneration, lumbar region: Secondary | ICD-10-CM | POA: Diagnosis not present

## 2022-05-20 DIAGNOSIS — M9905 Segmental and somatic dysfunction of pelvic region: Secondary | ICD-10-CM | POA: Diagnosis not present

## 2022-05-26 ENCOUNTER — Ambulatory Visit (INDEPENDENT_AMBULATORY_CARE_PROVIDER_SITE_OTHER): Payer: Medicare Other

## 2022-05-26 ENCOUNTER — Encounter: Payer: Self-pay | Admitting: Orthopaedic Surgery

## 2022-05-26 ENCOUNTER — Ambulatory Visit: Payer: Medicare Other | Admitting: Orthopaedic Surgery

## 2022-05-26 VITALS — BP 118/72 | HR 63 | Ht 63.0 in | Wt 148.0 lb

## 2022-05-26 DIAGNOSIS — M79674 Pain in right toe(s): Secondary | ICD-10-CM | POA: Diagnosis not present
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
## 2022-06-01 DIAGNOSIS — M9905 Segmental and somatic dysfunction of pelvic region: Secondary | ICD-10-CM | POA: Diagnosis not present

## 2022-06-01 DIAGNOSIS — M9903 Segmental and somatic dysfunction of lumbar region: Secondary | ICD-10-CM | POA: Diagnosis not present

## 2022-06-01 DIAGNOSIS — M9904 Segmental and somatic dysfunction of sacral region: Secondary | ICD-10-CM | POA: Diagnosis not present

## 2022-06-01 DIAGNOSIS — M5136 Other intervertebral disc degeneration, lumbar region: Secondary | ICD-10-CM | POA: Diagnosis not present

## 2022-06-19 ENCOUNTER — Other Ambulatory Visit: Payer: Self-pay | Admitting: Family Medicine

## 2022-06-22 DIAGNOSIS — M9904 Segmental and somatic dysfunction of sacral region: Secondary | ICD-10-CM | POA: Diagnosis not present

## 2022-06-22 DIAGNOSIS — M9903 Segmental and somatic dysfunction of lumbar region: Secondary | ICD-10-CM | POA: Diagnosis not present

## 2022-06-22 DIAGNOSIS — M5136 Other intervertebral disc degeneration, lumbar region: Secondary | ICD-10-CM | POA: Diagnosis not present

## 2022-06-22 DIAGNOSIS — M9905 Segmental and somatic dysfunction of pelvic region: Secondary | ICD-10-CM | POA: Diagnosis not present

## 2022-07-14 DIAGNOSIS — M5136 Other intervertebral disc degeneration, lumbar region: Secondary | ICD-10-CM | POA: Diagnosis not present

## 2022-07-14 DIAGNOSIS — M9905 Segmental and somatic dysfunction of pelvic region: Secondary | ICD-10-CM | POA: Diagnosis not present

## 2022-07-14 DIAGNOSIS — M9903 Segmental and somatic dysfunction of lumbar region: Secondary | ICD-10-CM | POA: Diagnosis not present

## 2022-07-14 DIAGNOSIS — M9904 Segmental and somatic dysfunction of sacral region: Secondary | ICD-10-CM | POA: Diagnosis not present

## 2022-08-05 DIAGNOSIS — M9905 Segmental and somatic dysfunction of pelvic region: Secondary | ICD-10-CM | POA: Diagnosis not present

## 2022-08-05 DIAGNOSIS — M5136 Other intervertebral disc degeneration, lumbar region: Secondary | ICD-10-CM | POA: Diagnosis not present

## 2022-08-05 DIAGNOSIS — M9903 Segmental and somatic dysfunction of lumbar region: Secondary | ICD-10-CM | POA: Diagnosis not present

## 2022-08-05 DIAGNOSIS — M9904 Segmental and somatic dysfunction of sacral region: Secondary | ICD-10-CM | POA: Diagnosis not present

## 2022-08-12 DIAGNOSIS — H4720 Unspecified optic atrophy: Secondary | ICD-10-CM | POA: Diagnosis not present

## 2022-08-12 DIAGNOSIS — H2513 Age-related nuclear cataract, bilateral: Secondary | ICD-10-CM | POA: Diagnosis not present

## 2022-08-12 DIAGNOSIS — H04123 Dry eye syndrome of bilateral lacrimal glands: Secondary | ICD-10-CM | POA: Diagnosis not present

## 2022-08-12 DIAGNOSIS — H25013 Cortical age-related cataract, bilateral: Secondary | ICD-10-CM | POA: Diagnosis not present

## 2022-08-14 ENCOUNTER — Ambulatory Visit: Payer: Medicare Other | Admitting: Internal Medicine

## 2022-08-27 ENCOUNTER — Other Ambulatory Visit: Payer: Self-pay | Admitting: *Deleted

## 2022-08-27 ENCOUNTER — Telehealth: Payer: Self-pay | Admitting: Orthopaedic Surgery

## 2022-08-27 DIAGNOSIS — M7061 Trochanteric bursitis, right hip: Secondary | ICD-10-CM

## 2022-08-27 DIAGNOSIS — Z96641 Presence of right artificial hip joint: Secondary | ICD-10-CM

## 2022-08-27 NOTE — Telephone Encounter (Signed)
noted 

## 2022-08-27 NOTE — Telephone Encounter (Signed)
Were you going to call patient in regards to therapy or would you like for me to?  Do you need for me to enter referral?
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
## 2022-09-01 ENCOUNTER — Ambulatory Visit: Payer: Medicare Other

## 2022-09-01 ENCOUNTER — Telehealth (INDEPENDENT_AMBULATORY_CARE_PROVIDER_SITE_OTHER): Payer: Medicare Other

## 2022-09-01 VITALS — Ht 63.0 in | Wt 150.0 lb

## 2022-09-01 DIAGNOSIS — Z0000 Encounter for general adult medical examination without abnormal findings: Secondary | ICD-10-CM

## 2022-09-01 NOTE — Patient Instructions (Addendum)
Ms. Christy Douglas , Thank you for taking time to come for your Medicare Wellness Visit. I appreciate your ongoing commitment to your health goals. Please review the following plan we discussed and let me know if I can assist you in the future.   These are the goals we discussed:  Goals       Patient Stated (pt-stated)      No current goals, But I would like to get back to walking my dog.      Patient Stated      08/25/2021, get spur on vertebrae taking care of        This is a list of the screening recommended for you and due dates:  Health Maintenance  Topic Date Due   DTaP/Tdap/Td vaccine (1 - Tdap) Never done   COVID-19 Vaccine (5 - 2023-24 season) 09/17/2022*   Hepatitis C Screening: USPSTF Recommendation to screen - Ages 18-79 yo.  09/02/2023*   HIV Screening  09/02/2023*   Mammogram  03/12/2023   Medicare Annual Wellness Visit  09/02/2023   Pap Smear  04/11/2024   Colon Cancer Screening  02/21/2030   Flu Shot  Completed   Zoster (Shingles) Vaccine  Completed   HPV Vaccine  Aged Out  *Topic was postponed. The date shown is not the original due date.  Opioid Pain Medicine Management Opioids are powerful medicines that are used to treat moderate to severe pain. When used for short periods of time, they can help you to: Sleep better. Do better in physical or occupational therapy. Feel better in the first few days after an injury. Recover from surgery. Opioids should be taken with the supervision of a trained health care provider. They should be taken for the shortest period of time possible. This is because opioids can be addictive, and the longer you take opioids, the greater your risk of addiction. This addiction can also be called opioid use disorder. What are the risks? Using opioid pain medicines for longer than 3 days increases your risk of side effects. Side effects include: Constipation. Nausea and vomiting. Breathing difficulties (respiratory  depression). Drowsiness. Confusion. Opioid use disorder. Itching. Taking opioid pain medicine for a long period of time can affect your ability to do daily tasks. It also puts you at risk for: Motor vehicle crashes. Depression. Suicide. Heart attack. Overdose, which can be life-threatening. What is a pain treatment plan? A pain treatment plan is an agreement between you and your health care provider. Pain is unique to each person, and treatments vary depending on your condition. To manage your pain, you and your health care provider need to work together. To help you do this: Discuss the goals of your treatment, including how much pain you might expect to have and how you will manage the pain. Review the risks and benefits of taking opioid medicines. Remember that a good treatment plan uses more than one approach and minimizes the chance of side effects. Be honest about the amount of medicines you take and about any drug or alcohol use. Get pain medicine prescriptions from only one health care provider. Pain can be managed with many types of alternative treatments. Ask your health care provider to refer you to one or more specialists who can help you manage pain through: Physical or occupational therapy. Counseling (cognitive behavioral therapy). Good nutrition. Biofeedback. Massage. Meditation. Non-opioid medicine. Following a gentle exercise program. How to use opioid pain medicine Taking medicine Take your pain medicine exactly as told by your health care  provider. Take it only when you need it. If your pain gets less severe, you may take less than your prescribed dose if your health care provider approves. If you are not having pain, do nottake pain medicine unless your health care provider tells you to take it. If your pain is severe, do nottry to treat it yourself by taking more pills than instructed on your prescription. Contact your health care provider for help. Write down  the times when you take your pain medicine. It is easy to become confused while on pain medicine. Writing the time can help you avoid overdose. Take other over-the-counter or prescription medicines only as told by your health care provider. Keeping yourself and others safe  While you are taking opioid pain medicine: Do not drive, use machinery, or power tools. Do not sign legal documents. Do not drink alcohol. Do not take sleeping pills. Do not supervise children by yourself. Do not do activities that require climbing or being in high places. Do not go to a lake, river, ocean, spa, or swimming pool. Do not share your pain medicine with anyone. Keep pain medicine in a locked cabinet or in a secure area where pets and children cannot reach it. Stopping your use of opioids If you have been taking opioid medicine for more than a few weeks, you may need to slowly decrease (taper) how much you take until you stop completely. Tapering your use of opioids can decrease your risk of symptoms of withdrawal, such as: Pain and cramping in the abdomen. Nausea. Sweating. Sleepiness. Restlessness. Uncontrollable shaking (tremors). Cravings for the medicine. Do not attempt to taper your use of opioids on your own. Talk with your health care provider about how to do this. Your health care provider may prescribe a step-down schedule based on how much medicine you are taking and how long you have been taking it. Getting rid of leftover pills Do not save any leftover pills. Get rid of leftover pills safely by: Taking the medicine to a prescription take-back program. This is usually offered by the county or law enforcement. Bringing them to a pharmacy that has a drug disposal container. Flushing them down the toilet. Check the label or package insert of your medicine to see whether this is safe to do. Throwing them out in the trash. Check the label or package insert of your medicine to see whether this is  safe to do. If it is safe to throw it out, remove the medicine from the original container, put it into a sealable bag or container, and mix it with used coffee grounds, food scraps, dirt, or cat litter before putting it in the trash. Follow these instructions at home: Activity Do exercises as told by your health care provider. Avoid activities that make your pain worse. Return to your normal activities as told by your health care provider. Ask your health care provider what activities are safe for you. General instructions You may need to take these actions to prevent or treat constipation: Drink enough fluid to keep your urine pale yellow. Take over-the-counter or prescription medicines. Eat foods that are high in fiber, such as beans, whole grains, and fresh fruits and vegetables. Limit foods that are high in fat and processed sugars, such as fried or sweet foods. Keep all follow-up visits. This is important. Where to find support If you have been taking opioids for a long time, you may benefit from receiving support for quitting from a local support group or counselor.  Ask your health care provider for a referral to these resources in your area. Where to find more information Centers for Disease Control and Prevention (CDC): http://www.wolf.info/ [place] Food and Drug Administration (FDA): [employer] Get help right away if: You may have taken too much of an opioid (overdosed). Common symptoms of an overdose: Your breathing is slower or more shallow than normal. You have a very slow heartbeat (pulse). You have slurred speech. You have nausea and vomiting. Your pupils become very small. You have other potential symptoms: You are very confused. You faint or feel like you will faint. You have cold, clammy skin. You have blue lips or fingernails. You have thoughts of harming yourself or harming others. These symptoms may represent a serious problem that is an emergency. Do not wait to see if the  symptoms will go away. Get medical help right away. Call your local emergency services (911 in the [place] Do not drive yourself to the hospital.  If you ever feel like you may hurt yourself or others, or have thoughts about taking your own life, get help right away. Go to your nearest emergency department or: Call your local emergency services (911 in the [place] Call the [facility] [phone] in the [place] Call a suicide crisis helpline, such as the [facility] at 601-144-0038 or 988 in the [place]. This is open 24 hours a day in the [place] Text the Crisis Text Line at 346-526-1804 (in the [place].). Summary Opioid medicines can help you manage moderate to severe pain for a short period of time. A pain treatment plan is an agreement between you and your health care provider. Discuss the goals of your treatment, including how much pain you might expect to have and how you will manage the pain. If you think that you or someone else may have taken too much of an opioid, get medical help right away. This information is not intended to replace advice given to you by your health care provider. Make sure you discuss any questions you have with your health care provider. Document Revised: 02/12/2021 Document Reviewed: 10/30/2020 Elsevier Patient Education  [place] directives: Please bring a copy of your health care power of attorney and living will to the office to be added to your chart at your convenience.   Conditions/risks identified: None  Next appointment: Follow up in one year for your annual wellness visit.    Preventive Care 40-64 Years, Female Preventive care refers to lifestyle choices and visits with your health care provider that can promote health and wellness. What does preventive care include? A yearly physical exam. This is also called an annual well check. Dental exams once or twice a year. Routine eye exams. Ask your  health care provider how often you should have your eyes checked. Personal lifestyle choices, including: Daily care of your teeth and gums. Regular physical activity. Eating a healthy diet. Avoiding tobacco and drug use. Limiting alcohol use. Practicing safe sex. Taking low-dose aspirin daily starting at age 2. Taking vitamin and mineral supplements as recommended by your health care provider. What happens during an annual well check? The services and screenings done by your health care provider during your annual well check will depend on your age, overall health, lifestyle risk factors, and family history of disease. Counseling  Your health care provider may ask you questions about your: Alcohol use. Tobacco use. Drug use. Emotional well-being. Home and relationship well-being. Sexual activity. Eating habits.  Work and work Statistician. Method of birth control. Menstrual cycle. Pregnancy history. Screening  You may have the following tests or measurements: Height, weight, and BMI. Blood pressure. Lipid and cholesterol levels. These may be checked every 5 years, or more frequently if you are over 15 years old. Skin check. Lung cancer screening. You may have this screening every year starting at age 52 if you have a 30-pack-year history of smoking and currently smoke or have quit within the past 15 years. Fecal occult blood test (FOBT) of the stool. You may have this test every year starting at age 36. Flexible sigmoidoscopy or colonoscopy. You may have a sigmoidoscopy every 5 years or a colonoscopy every 10 years starting at age 38. Hepatitis C blood test. Hepatitis B blood test. Sexually transmitted disease (STD) testing. Diabetes screening. This is done by checking your blood sugar (glucose) after you have not eaten for a while (fasting). You may have this done every 1-3 years. Mammogram. This may be done every 1-2 years. Talk to your health care provider about when you  should start having regular mammograms. This may depend on whether you have a family history of breast cancer. BRCA-related cancer screening. This may be done if you have a family history of breast, ovarian, tubal, or peritoneal cancers. Pelvic exam and Pap test. This may be done every 3 years starting at age 85. Starting at age 68, this may be done every 5 years if you have a Pap test in combination with an HPV test. Bone density scan. This is done to screen for osteoporosis. You may have this scan if you are at high risk for osteoporosis. Discuss your test results, treatment options, and if necessary, the need for more tests with your health care provider. Vaccines  Your health care provider may recommend certain vaccines, such as: Influenza vaccine. This is recommended every year. Tetanus, diphtheria, and acellular pertussis (Tdap, Td) vaccine. You may need a Td booster every 10 years. Zoster vaccine. You may need this after age 60. Pneumococcal 13-valent conjugate (PCV13) vaccine. You may need this if you have certain conditions and were not previously vaccinated. Pneumococcal polysaccharide (PPSV23) vaccine. You may need one or two doses if you smoke cigarettes or if you have certain conditions. Talk to your health care provider about which screenings and vaccines you need and how often you need them. This information is not intended to replace advice given to you by your health care provider. Make sure you discuss any questions you have with your health care provider. Document Released: 08/16/2015 Document Revised: 04/08/2016 Document Reviewed: 05/21/2015 Elsevier Interactive Patient Education  2017 [place] Prevention in the Home Falls can cause injuries. They can happen to people of all ages. There are many things you can do to make your home safe and to help prevent falls. What can I do on the outside of my home? Regularly fix the edges of walkways and driveways and fix  any cracks. Remove anything that might make you trip as you walk through a door, such as a raised step or threshold. Trim any bushes or trees on the path to your home. Use bright outdoor lighting. Clear any walking paths of anything that might make someone trip, such as rocks or tools. Regularly check to see if handrails are loose or broken. Make sure that both sides of any steps have handrails. Any raised decks and porches should have guardrails on the edges. Have any leaves, snow, or  ice cleared regularly. Use sand or salt on walking paths during winter. Clean up any spills in your garage right away. This includes oil or grease spills. What can I do in the bathroom? Use night lights. Install grab bars by the toilet and in the tub and shower. Do not use towel bars as grab bars. Use non-skid mats or decals in the tub or shower. If you need to sit down in the shower, use a plastic, non-slip stool. Keep the floor dry. Clean up any water that spills on the floor as soon as it happens. Remove soap buildup in the tub or shower regularly. Attach bath mats securely with double-sided non-slip rug tape. Do not have throw rugs and other things on the floor that can make you trip. What can I do in the bedroom? Use night lights. Make sure that you have a light by your bed that is easy to reach. Do not use any sheets or blankets that are too big for your bed. They should not hang down onto the floor. Have a firm chair that has side arms. You can use this for support while you get dressed. Do not have throw rugs and other things on the floor that can make you trip. What can I do in the kitchen? Clean up any spills right away. Avoid walking on wet floors. Keep items that you use a lot in easy-to-reach places. If you need to reach something above you, use a strong step stool that has a grab bar. Keep electrical cords out of the way. Do not use floor polish or wax that makes floors slippery. If you  must use wax, use non-skid floor wax. Do not have throw rugs and other things on the floor that can make you trip. What can I do with my stairs? Do not leave any items on the stairs. Make sure that there are handrails on both sides of the stairs and use them. Fix handrails that are broken or loose. Make sure that handrails are as long as the stairways. Check any carpeting to make sure that it is firmly attached to the stairs. Fix any carpet that is loose or worn. Avoid having throw rugs at the top or bottom of the stairs. If you do have throw rugs, attach them to the floor with carpet tape. Make sure that you have a light switch at the top of the stairs and the bottom of the stairs. If you do not have them, ask someone to add them for you. What else can I do to help prevent falls? Wear shoes that: Do not have high heels. Have rubber bottoms. Are comfortable and fit you well. Are closed at the toe. Do not wear sandals. If you use a stepladder: Make sure that it is fully opened. Do not climb a closed stepladder. Make sure that both sides of the stepladder are locked into place. Ask someone to hold it for you, if possible. Clearly mark and make sure that you can see: Any grab bars or handrails. First and last steps. Where the edge of each step is. Use tools that help you move around (mobility aids) if they are needed. These include: Canes. Walkers. Scooters. Crutches. Turn on the lights when you go into a dark area. Replace any light bulbs as soon as they burn out. Set up your furniture so you have a clear path. Avoid moving your furniture around. If any of your floors are uneven, fix them. If there are any pets  around you, be aware of where they are. Review your medicines with your doctor. Some medicines can make you feel dizzy. This can increase your chance of falling. Ask your doctor what other things that you can do to help prevent falls. This information is not intended to replace  advice given to you by your health care provider. Make sure you discuss any questions you have with your health care provider. Document Released: 05/16/2009 Document Revised: 12/26/2015 Document Reviewed: 08/24/2014 Elsevier Interactive Patient Education  2017 [employer].
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
## 2022-09-02 DIAGNOSIS — M5136 Other intervertebral disc degeneration, lumbar region: Secondary | ICD-10-CM | POA: Diagnosis not present

## 2022-09-02 DIAGNOSIS — M9905 Segmental and somatic dysfunction of pelvic region: Secondary | ICD-10-CM | POA: Diagnosis not present

## 2022-09-02 DIAGNOSIS — M9903 Segmental and somatic dysfunction of lumbar region: Secondary | ICD-10-CM | POA: Diagnosis not present

## 2022-09-02 DIAGNOSIS — M9904 Segmental and somatic dysfunction of sacral region: Secondary | ICD-10-CM | POA: Diagnosis not present

## 2022-09-03 ENCOUNTER — Ambulatory Visit: Payer: Medicare Other | Attending: Orthopaedic Surgery | Admitting: Physical Therapy

## 2022-09-03 DIAGNOSIS — M6281 Muscle weakness (generalized): Secondary | ICD-10-CM | POA: Diagnosis not present

## 2022-09-03 DIAGNOSIS — R2689 Other abnormalities of gait and mobility: Secondary | ICD-10-CM | POA: Diagnosis not present

## 2022-09-03 DIAGNOSIS — Z96641 Presence of right artificial hip joint: Secondary | ICD-10-CM | POA: Insufficient documentation

## 2022-09-03 DIAGNOSIS — M7061 Trochanteric bursitis, right hip: Secondary | ICD-10-CM | POA: Diagnosis not present

## 2022-09-07 ENCOUNTER — Telehealth: Payer: Self-pay | Admitting: Family Medicine

## 2022-09-07 LAB — HM DEXA SCAN: HM Dexa Scan: 0

## 2022-09-07 NOTE — Telephone Encounter (Addendum)
There seems to be some miscommunication somewhere.  If you look under Chart Review Visit dated  09/03/22 -  Outpatient Rehab - PT Initial Evaluation - Lynden Ang, PT, Rehabilitation - [facility]  and open that note, at the top of the page it says: S/P total right hip arthroplasty.    At first, she said it was her left hip, but then realized the scar is on her right side and corrected herself, which is where all the confusion was coming from.

## 2022-09-07 NOTE — Telephone Encounter (Addendum)
Pt called to inform MD that according to her PT, the information received regarding PT for her left side (Leg & Hip) is incorrect, and it should say right side.  At first, she said it was her left hip, but then realized the scar is on her right side and corrected herself, which is where all the confusion was coming from.   On our side, it does indeed show right hip replacement, not left.  Not sure, what information she is being given by PT. Pt states she needs it corrected for insurance purposes. Pt would like this correction made, as soon as possible. Pt was informed she may need to speak to PT.   LOV:  08/15/21  Please advise.

## 2022-09-07 NOTE — Telephone Encounter (Signed)
Called and spoke with patient.   She is currently a patient at Dr. Lorin Mercy office in [place], which she sees Dr. Lorin Mercy for Total Right Hip arthroplasty.      Patient stated that information is being transmitted to her insurance company from our office stating it's her LEFT HIP, which is incorrect.   Patient said it has always been her RIGHT HIP.         Patient asked if that past information can be corrected due to insurance purposes.       I've reviewed the patient's chart back until 2015, I was unable to located any inform from our office stating LEFT HIP.   Patient has appointment scheduled for CPE on 09/25/22.

## 2022-09-07 NOTE — Telephone Encounter (Signed)
We have nothing to do with this. Dr. Lorin Mercy did the referral on 08-27-22. She needs to address any concerns with his office

## 2022-09-08 ENCOUNTER — Other Ambulatory Visit: Payer: Self-pay

## 2022-09-08 MED ORDER — LEVOTHYROXINE SODIUM 25 MCG PO TABS: 25.0000 ug | ORAL_TABLET | Freq: Every day | ORAL | 0 refills | Status: DC

## 2022-09-08 NOTE — Telephone Encounter (Addendum)
FYI Spoke with patient, she stated that [facility] contact her on 09/07/22 with information that their office is to complete the corrections in her medical record.      Patient extended her deepest apologies.

## 2022-09-09 DIAGNOSIS — M9905 Segmental and somatic dysfunction of pelvic region: Secondary | ICD-10-CM | POA: Diagnosis not present

## 2022-09-09 DIAGNOSIS — M9904 Segmental and somatic dysfunction of sacral region: Secondary | ICD-10-CM | POA: Diagnosis not present

## 2022-09-09 DIAGNOSIS — M9903 Segmental and somatic dysfunction of lumbar region: Secondary | ICD-10-CM | POA: Diagnosis not present

## 2022-09-09 DIAGNOSIS — M5136 Other intervertebral disc degeneration, lumbar region: Secondary | ICD-10-CM | POA: Diagnosis not present

## 2022-09-10 ENCOUNTER — Ambulatory Visit: Payer: Medicare Other

## 2022-09-10 DIAGNOSIS — R2689 Other abnormalities of gait and mobility: Secondary | ICD-10-CM

## 2022-09-10 DIAGNOSIS — M7061 Trochanteric bursitis, right hip: Secondary | ICD-10-CM | POA: Diagnosis not present

## 2022-09-10 DIAGNOSIS — M6281 Muscle weakness (generalized): Secondary | ICD-10-CM | POA: Diagnosis not present

## 2022-09-10 DIAGNOSIS — Z96641 Presence of right artificial hip joint: Secondary | ICD-10-CM | POA: Diagnosis not present
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
## 2022-09-15 ENCOUNTER — Ambulatory Visit: Payer: Medicare Other | Admitting: Physical Therapy

## 2022-09-15 ENCOUNTER — Encounter: Payer: Self-pay | Admitting: Physical Therapy

## 2022-09-15 DIAGNOSIS — M6281 Muscle weakness (generalized): Secondary | ICD-10-CM

## 2022-09-15 DIAGNOSIS — M7061 Trochanteric bursitis, right hip: Secondary | ICD-10-CM

## 2022-09-15 DIAGNOSIS — Z96641 Presence of right artificial hip joint: Secondary | ICD-10-CM

## 2022-09-15 DIAGNOSIS — R2689 Other abnormalities of gait and mobility: Secondary | ICD-10-CM | POA: Diagnosis not present
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
## 2022-09-22 ENCOUNTER — Ambulatory Visit: Payer: Medicare Other

## 2022-09-22 DIAGNOSIS — Z96641 Presence of right artificial hip joint: Secondary | ICD-10-CM

## 2022-09-22 DIAGNOSIS — R2689 Other abnormalities of gait and mobility: Secondary | ICD-10-CM

## 2022-09-22 DIAGNOSIS — M6281 Muscle weakness (generalized): Secondary | ICD-10-CM

## 2022-09-22 DIAGNOSIS — M7061 Trochanteric bursitis, right hip: Secondary | ICD-10-CM
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
## 2022-09-23 ENCOUNTER — Other Ambulatory Visit: Payer: Self-pay | Admitting: Family Medicine

## 2022-09-23 DIAGNOSIS — M9905 Segmental and somatic dysfunction of pelvic region: Secondary | ICD-10-CM | POA: Diagnosis not present

## 2022-09-23 DIAGNOSIS — M9903 Segmental and somatic dysfunction of lumbar region: Secondary | ICD-10-CM | POA: Diagnosis not present

## 2022-09-23 DIAGNOSIS — M9904 Segmental and somatic dysfunction of sacral region: Secondary | ICD-10-CM | POA: Diagnosis not present

## 2022-09-23 DIAGNOSIS — M5136 Other intervertebral disc degeneration, lumbar region: Secondary | ICD-10-CM | POA: Diagnosis not present

## 2022-09-25 ENCOUNTER — Encounter: Payer: Medicare Other | Admitting: Family Medicine

## 2022-09-28 ENCOUNTER — Ambulatory Visit: Payer: Medicare Other

## 2022-09-28 ENCOUNTER — Ambulatory Visit (INDEPENDENT_AMBULATORY_CARE_PROVIDER_SITE_OTHER): Payer: Medicare Other | Admitting: Family Medicine

## 2022-09-28 ENCOUNTER — Encounter: Payer: Self-pay | Admitting: Family Medicine

## 2022-09-28 VITALS — BP 120/80 | HR 83 | Temp 97.6°F | Ht 63.0 in | Wt 161.0 lb

## 2022-09-28 DIAGNOSIS — M810 Age-related osteoporosis without current pathological fracture: Secondary | ICD-10-CM | POA: Diagnosis not present

## 2022-09-28 DIAGNOSIS — E210 Primary hyperparathyroidism: Secondary | ICD-10-CM | POA: Diagnosis not present

## 2022-09-28 DIAGNOSIS — M21371 Foot drop, right foot: Secondary | ICD-10-CM | POA: Diagnosis not present

## 2022-09-28 DIAGNOSIS — L309 Dermatitis, unspecified: Secondary | ICD-10-CM | POA: Diagnosis not present

## 2022-09-28 DIAGNOSIS — R739 Hyperglycemia, unspecified: Secondary | ICD-10-CM

## 2022-09-28 DIAGNOSIS — E039 Hypothyroidism, unspecified: Secondary | ICD-10-CM | POA: Diagnosis not present

## 2022-09-28 DIAGNOSIS — E785 Hyperlipidemia, unspecified: Secondary | ICD-10-CM

## 2022-09-28 DIAGNOSIS — Z96641 Presence of right artificial hip joint: Secondary | ICD-10-CM

## 2022-09-28 DIAGNOSIS — R2689 Other abnormalities of gait and mobility: Secondary | ICD-10-CM

## 2022-09-28 DIAGNOSIS — M7061 Trochanteric bursitis, right hip: Secondary | ICD-10-CM | POA: Diagnosis not present

## 2022-09-28 DIAGNOSIS — M6281 Muscle weakness (generalized): Secondary | ICD-10-CM | POA: Diagnosis not present

## 2022-09-28 LAB — BASIC METABOLIC PANEL
BUN: 13 mg/dL (ref 6–23)
CO2: 28 mEq/L (ref 19–32)
Calcium: 11.3 mg/dL — ABNORMAL HIGH (ref 8.4–10.5)
Chloride: 104 mEq/L (ref 96–112)
Creatinine, Ser: 0.59 mg/dL (ref 0.40–1.20)
GFR: 95.18 mL/min (ref >60.00)
Glucose, Bld: 87 mg/dL (ref 70–99)
Potassium: 4.1 mEq/L (ref 3.5–5.1)
Sodium: 139 mEq/L (ref 135–145)

## 2022-09-28 LAB — HEPATIC FUNCTION PANEL
ALT: 30 U/L (ref 0–35)
AST: 29 U/L (ref 0–37)
Albumin: 4.4 g/dL (ref 3.5–5.2)
Alkaline Phosphatase: 69 U/L (ref 39–117)
Bilirubin, Direct: 0.1 mg/dL (ref 0.0–0.3)
Total Bilirubin: 0.4 mg/dL (ref 0.2–1.2)
Total Protein: 7.9 g/dL (ref 6.0–8.3)

## 2022-09-28 LAB — CBC WITH DIFFERENTIAL/PLATELET
Basophils Absolute: 0 10*3/uL (ref 0.0–0.1)
Basophils Relative: 0.7 % (ref 0.0–3.0)
Eosinophils Absolute: 0.1 10*3/uL (ref 0.0–0.7)
Eosinophils Relative: 2.2 % (ref 0.0–5.0)
HCT: 41.6 % (ref 36.0–46.0)
Hemoglobin: 14.3 g/dL (ref 12.0–15.0)
Lymphocytes Relative: 26.5 % (ref 12.0–46.0)
Lymphs Abs: 1.5 10*3/uL (ref 0.7–4.0)
MCHC: 34.3 g/dL (ref 30.0–36.0)
MCV: 92 fl (ref 78.0–100.0)
Monocytes Absolute: 0.5 10*3/uL (ref 0.1–1.0)
Monocytes Relative: 8.6 % (ref 3.0–12.0)
Neutro Abs: 3.5 10*3/uL (ref 1.4–7.7)
Neutrophils Relative %: 62 % (ref 43.0–77.0)
Platelets: 222 10*3/uL (ref 150.0–400.0)
RBC: 4.52 Mil/uL (ref 3.87–5.11)
RDW: 13.3 % (ref 11.5–15.5)
WBC: 5.7 10*3/uL (ref 4.0–10.5)

## 2022-09-28 LAB — LIPID PANEL
Cholesterol: 233 mg/dL — ABNORMAL HIGH (ref 0–200)
HDL: 84 mg/dL (ref >39.00)
LDL Cholesterol: 127 mg/dL — ABNORMAL HIGH (ref 0–99)
NonHDL: 148.99
Total CHOL/HDL Ratio: 3
Triglycerides: 112 mg/dL (ref 0.0–149.0)
VLDL: 22.4 mg/dL (ref 0.0–40.0)

## 2022-09-28 LAB — T4, FREE: Free T4: 0.61 ng/dL (ref 0.60–1.60)

## 2022-09-28 LAB — HEMOGLOBIN A1C: Hgb A1c MFr Bld: 5.6 % (ref 4.6–6.5)

## 2022-09-28 LAB — T3, FREE: T3, Free: 3.2 pg/mL (ref 2.3–4.2)

## 2022-09-28 LAB — TSH: TSH: 1.63 u[IU]/mL (ref 0.35–5.50)

## 2022-09-28 MED ORDER — METFORMIN HCL 500 MG PO TABS: 500.0000 mg | ORAL_TABLET | Freq: Two times a day (BID) | ORAL | 3 refills | Status: DC

## 2022-09-28 MED ORDER — LEVOTHYROXINE SODIUM 25 MCG PO TABS: 25.0000 ug | ORAL_TABLET | Freq: Every day | ORAL | 3 refills | Status: DC

## 2022-09-28 NOTE — Progress Notes (Signed)
Subjective:    Patient ID: Christy Douglas, female    DOB: 11/28/1955, 67 y.o.   MRN: [id number]  HPI Here to follow up on issues. She feels well in general, though she asks for help with losing weight. She has gained 17 lbs since last October. She had a right hip total arthroplasty last March, and this went well. She is currently going to PT for this. She sees Dr. Kelton Pillar yearly for primary hyperparathyroidism. She had been taking Alendronate for osteoporosis per her GYN, but this was recently stopped since she had been on it for 5 years.    Review of Systems  Constitutional: Negative.   HENT: Negative.    Eyes: Negative.   Respiratory: Negative.    Cardiovascular: Negative.   Gastrointestinal: Negative.   Genitourinary:  Negative for decreased urine volume, difficulty urinating, dyspareunia, dysuria, enuresis, flank pain, frequency, hematuria, pelvic pain and urgency.  Musculoskeletal:  Positive for arthralgias.  Skin: Negative.   Neurological: Negative.  Negative for headaches.  Psychiatric/Behavioral: Negative.         Objective:   Physical Exam Constitutional:      General: She is not in acute distress.    Appearance: Normal appearance. She is well-developed.  HENT:     Head: Normocephalic and atraumatic.     Right Ear: External ear normal.     Left Ear: External ear normal.     Nose: Nose normal.     Mouth/Throat:     Pharynx: No oropharyngeal exudate.  Eyes:     General: No scleral icterus.    Conjunctiva/sclera: Conjunctivae normal.     Pupils: Pupils are equal, round, and reactive to light.  Neck:     Thyroid: No thyromegaly.     Vascular: No JVD.  Cardiovascular:     Rate and Rhythm: Normal rate and regular rhythm.     Heart sounds: Normal heart sounds. No murmur heard.    No friction rub. No gallop.  Pulmonary:     Effort: Pulmonary effort is normal. No respiratory distress.     Breath sounds: Normal breath sounds. No wheezing or rales.  Chest:      Chest wall: No tenderness.  Abdominal:     General: Bowel sounds are normal. There is no distension.     Palpations: Abdomen is soft. There is no mass.     Tenderness: There is no abdominal tenderness. There is no guarding or rebound.  Musculoskeletal:        General: No tenderness. Normal range of motion.     Cervical back: Normal range of motion and neck supple.  Lymphadenopathy:     Cervical: No cervical adenopathy.  Skin:    General: Skin is warm and dry.     Findings: No erythema or rash.  Neurological:     Mental Status: She is alert and oriented to person, place, and time.     Cranial Nerves: No cranial nerve deficit.     Motor: No abnormal muscle tone.     Coordination: Coordination normal.     Deep Tendon Reflexes: Reflexes are normal and symmetric. Reflexes normal.  Psychiatric:        Behavior: Behavior normal.        Thought Content: Thought content normal.        Judgment: Judgment normal.           Assessment & Plan:  She is recovering nicely from her hip replacement surgery. Her osteoporosis is stable (her last  DEXA was on 04-25-22). We will get fasting labs to check her thyroid levels, lipids, etc. She will follow up with Dr. Kelton Pillar on 01-12-23, and we will check a calcium level today. To help her with losing weight, she will start on Metformin 500 mg BID. We spent a total of (  33 ) minutes reviewing records and discussing these issues.  Alysia Penna, MD
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
## 2022-09-30 ENCOUNTER — Ambulatory Visit: Payer: Medicare Other

## 2022-09-30 DIAGNOSIS — R2689 Other abnormalities of gait and mobility: Secondary | ICD-10-CM | POA: Diagnosis not present

## 2022-09-30 DIAGNOSIS — M7061 Trochanteric bursitis, right hip: Secondary | ICD-10-CM | POA: Diagnosis not present

## 2022-09-30 DIAGNOSIS — M6281 Muscle weakness (generalized): Secondary | ICD-10-CM | POA: Diagnosis not present

## 2022-09-30 DIAGNOSIS — Z96641 Presence of right artificial hip joint: Secondary | ICD-10-CM | POA: Diagnosis not present
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
## 2022-10-06 ENCOUNTER — Encounter: Payer: Self-pay | Admitting: Physical Therapy

## 2022-10-06 ENCOUNTER — Ambulatory Visit: Payer: Medicare Other | Attending: Orthopaedic Surgery | Admitting: Physical Therapy

## 2022-10-06 DIAGNOSIS — Z96641 Presence of right artificial hip joint: Secondary | ICD-10-CM | POA: Insufficient documentation

## 2022-10-06 DIAGNOSIS — M9903 Segmental and somatic dysfunction of lumbar region: Secondary | ICD-10-CM | POA: Diagnosis not present

## 2022-10-06 DIAGNOSIS — M7061 Trochanteric bursitis, right hip: Secondary | ICD-10-CM | POA: Insufficient documentation

## 2022-10-06 DIAGNOSIS — M6281 Muscle weakness (generalized): Secondary | ICD-10-CM | POA: Insufficient documentation

## 2022-10-06 DIAGNOSIS — M9904 Segmental and somatic dysfunction of sacral region: Secondary | ICD-10-CM | POA: Diagnosis not present

## 2022-10-06 DIAGNOSIS — R2689 Other abnormalities of gait and mobility: Secondary | ICD-10-CM | POA: Insufficient documentation

## 2022-10-06 DIAGNOSIS — M9905 Segmental and somatic dysfunction of pelvic region: Secondary | ICD-10-CM | POA: Diagnosis not present

## 2022-10-06 DIAGNOSIS — M5136 Other intervertebral disc degeneration, lumbar region: Secondary | ICD-10-CM | POA: Diagnosis not present
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
## 2022-10-08 ENCOUNTER — Encounter: Payer: Self-pay | Admitting: Physical Therapy

## 2022-10-08 ENCOUNTER — Ambulatory Visit: Payer: Medicare Other | Admitting: Physical Therapy

## 2022-10-08 DIAGNOSIS — M7061 Trochanteric bursitis, right hip: Secondary | ICD-10-CM

## 2022-10-08 DIAGNOSIS — M6281 Muscle weakness (generalized): Secondary | ICD-10-CM

## 2022-10-08 DIAGNOSIS — R2689 Other abnormalities of gait and mobility: Secondary | ICD-10-CM | POA: Diagnosis not present

## 2022-10-08 DIAGNOSIS — Z96641 Presence of right artificial hip joint: Secondary | ICD-10-CM | POA: Diagnosis not present
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
## 2022-10-12 ENCOUNTER — Ambulatory Visit: Payer: Medicare Other

## 2022-10-12 DIAGNOSIS — M6281 Muscle weakness (generalized): Secondary | ICD-10-CM

## 2022-10-12 DIAGNOSIS — M7061 Trochanteric bursitis, right hip: Secondary | ICD-10-CM

## 2022-10-12 DIAGNOSIS — R2689 Other abnormalities of gait and mobility: Secondary | ICD-10-CM

## 2022-10-12 DIAGNOSIS — Z96641 Presence of right artificial hip joint: Secondary | ICD-10-CM
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
## 2022-10-14 ENCOUNTER — Ambulatory Visit: Payer: Medicare Other

## 2022-10-14 DIAGNOSIS — M7061 Trochanteric bursitis, right hip: Secondary | ICD-10-CM

## 2022-10-14 DIAGNOSIS — M6281 Muscle weakness (generalized): Secondary | ICD-10-CM

## 2022-10-14 DIAGNOSIS — Z96641 Presence of right artificial hip joint: Secondary | ICD-10-CM | POA: Diagnosis not present

## 2022-10-14 DIAGNOSIS — R2689 Other abnormalities of gait and mobility: Secondary | ICD-10-CM | POA: Diagnosis not present
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
## 2022-10-16 ENCOUNTER — Encounter: Payer: Self-pay | Admitting: Internal Medicine

## 2022-10-16 ENCOUNTER — Ambulatory Visit: Payer: Medicare Other | Admitting: Internal Medicine

## 2022-10-16 VITALS — BP 110/70 | HR 76 | Ht 63.0 in | Wt 158.0 lb

## 2022-10-16 DIAGNOSIS — E210 Primary hyperparathyroidism: Secondary | ICD-10-CM

## 2022-10-16 NOTE — Patient Instructions (Signed)
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
## 2022-10-19 ENCOUNTER — Ambulatory Visit: Payer: Medicare Other

## 2022-10-19 ENCOUNTER — Other Ambulatory Visit: Payer: Medicare Other

## 2022-10-19 DIAGNOSIS — Z96641 Presence of right artificial hip joint: Secondary | ICD-10-CM

## 2022-10-19 DIAGNOSIS — M6281 Muscle weakness (generalized): Secondary | ICD-10-CM | POA: Diagnosis not present

## 2022-10-19 DIAGNOSIS — E210 Primary hyperparathyroidism: Secondary | ICD-10-CM

## 2022-10-19 DIAGNOSIS — R2689 Other abnormalities of gait and mobility: Secondary | ICD-10-CM

## 2022-10-19 DIAGNOSIS — M7061 Trochanteric bursitis, right hip: Secondary | ICD-10-CM | POA: Diagnosis not present

## 2022-10-19 NOTE — Progress Notes (Unsigned)
Total volume 4500 ml in two containers Start date 10/18/22 End date 10/19/22 Start time 7:42 am  End time 7:42 am
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
## 2022-10-21 ENCOUNTER — Ambulatory Visit: Payer: Medicare Other

## 2022-10-21 DIAGNOSIS — M6281 Muscle weakness (generalized): Secondary | ICD-10-CM | POA: Diagnosis not present

## 2022-10-21 DIAGNOSIS — Z96641 Presence of right artificial hip joint: Secondary | ICD-10-CM | POA: Diagnosis not present

## 2022-10-21 DIAGNOSIS — M7061 Trochanteric bursitis, right hip: Secondary | ICD-10-CM

## 2022-10-21 DIAGNOSIS — R2689 Other abnormalities of gait and mobility: Secondary | ICD-10-CM | POA: Diagnosis not present
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
## 2022-10-22 LAB — CALCIUM, URINE, 24 HOUR: Calcium, 24H Urine: 504 mg/24 h — ABNORMAL HIGH

## 2022-10-22 LAB — CREATININE, URINE, 24 HOUR: Creatinine, 24H Ur: 0.99 g/(24.h) (ref 0.50–2.15)

## 2022-10-27 ENCOUNTER — Ambulatory Visit: Payer: Medicare Other

## 2022-10-27 DIAGNOSIS — M9904 Segmental and somatic dysfunction of sacral region: Secondary | ICD-10-CM | POA: Diagnosis not present

## 2022-10-27 DIAGNOSIS — M7061 Trochanteric bursitis, right hip: Secondary | ICD-10-CM | POA: Diagnosis not present

## 2022-10-27 DIAGNOSIS — Z96641 Presence of right artificial hip joint: Secondary | ICD-10-CM | POA: Diagnosis not present

## 2022-10-27 DIAGNOSIS — M9903 Segmental and somatic dysfunction of lumbar region: Secondary | ICD-10-CM | POA: Diagnosis not present

## 2022-10-27 DIAGNOSIS — M6281 Muscle weakness (generalized): Secondary | ICD-10-CM | POA: Diagnosis not present

## 2022-10-27 DIAGNOSIS — R2689 Other abnormalities of gait and mobility: Secondary | ICD-10-CM | POA: Diagnosis not present

## 2022-10-27 DIAGNOSIS — M9905 Segmental and somatic dysfunction of pelvic region: Secondary | ICD-10-CM | POA: Diagnosis not present

## 2022-10-27 DIAGNOSIS — M5136 Other intervertebral disc degeneration, lumbar region: Secondary | ICD-10-CM | POA: Diagnosis not present
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
## 2022-11-02 ENCOUNTER — Telehealth: Payer: Self-pay | Admitting: Internal Medicine

## 2022-11-02 NOTE — Telephone Encounter (Signed)
LMTCB

## 2022-11-02 NOTE — Telephone Encounter (Signed)
Patient was advised and states that she doesn't want to take the Hydrochlorothiazide because of the side effects.

## 2022-11-02 NOTE — Telephone Encounter (Signed)
Please contact the patient and see if she was able to read my message through the portal that was sent approximately a week ago?   She is leaking too much calcium which can lead to kidney damage as well as kidney stones, I have recommended hydrochlorothiazide to protect her kidneys, it is usually given as a blood pressure medicine but in this case it would be used to protect your kidneys and slow down the calcium leakage through the urine    Thanks

## 2022-11-05 ENCOUNTER — Ambulatory Visit: Payer: Medicare Other | Attending: Orthopaedic Surgery | Admitting: Physical Therapy

## 2022-11-05 ENCOUNTER — Encounter: Payer: Self-pay | Admitting: Physical Therapy

## 2022-11-05 DIAGNOSIS — M6281 Muscle weakness (generalized): Secondary | ICD-10-CM | POA: Diagnosis not present

## 2022-11-05 DIAGNOSIS — Z96641 Presence of right artificial hip joint: Secondary | ICD-10-CM | POA: Insufficient documentation

## 2022-11-05 DIAGNOSIS — M7061 Trochanteric bursitis, right hip: Secondary | ICD-10-CM | POA: Insufficient documentation

## 2022-11-05 DIAGNOSIS — R2689 Other abnormalities of gait and mobility: Secondary | ICD-10-CM | POA: Diagnosis not present
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
## 2022-11-09 ENCOUNTER — Ambulatory Visit: Payer: Medicare Other

## 2022-11-09 DIAGNOSIS — M7061 Trochanteric bursitis, right hip: Secondary | ICD-10-CM

## 2022-11-09 DIAGNOSIS — R2689 Other abnormalities of gait and mobility: Secondary | ICD-10-CM | POA: Diagnosis not present

## 2022-11-09 DIAGNOSIS — Z96641 Presence of right artificial hip joint: Secondary | ICD-10-CM | POA: Diagnosis not present

## 2022-11-09 DIAGNOSIS — M6281 Muscle weakness (generalized): Secondary | ICD-10-CM | POA: Diagnosis not present
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
## 2022-11-17 DIAGNOSIS — M9904 Segmental and somatic dysfunction of sacral region: Secondary | ICD-10-CM | POA: Diagnosis not present

## 2022-11-17 DIAGNOSIS — M9905 Segmental and somatic dysfunction of pelvic region: Secondary | ICD-10-CM | POA: Diagnosis not present

## 2022-11-17 DIAGNOSIS — M9903 Segmental and somatic dysfunction of lumbar region: Secondary | ICD-10-CM | POA: Diagnosis not present

## 2022-11-17 DIAGNOSIS — M5136 Other intervertebral disc degeneration, lumbar region: Secondary | ICD-10-CM | POA: Diagnosis not present

## 2022-11-18 ENCOUNTER — Ambulatory Visit: Payer: Medicare Other | Admitting: Physical Therapy

## 2022-11-18 ENCOUNTER — Encounter: Payer: Self-pay | Admitting: Physical Therapy

## 2022-11-18 DIAGNOSIS — M7061 Trochanteric bursitis, right hip: Secondary | ICD-10-CM | POA: Diagnosis not present

## 2022-11-18 DIAGNOSIS — M6281 Muscle weakness (generalized): Secondary | ICD-10-CM | POA: Diagnosis not present

## 2022-11-18 DIAGNOSIS — Z96641 Presence of right artificial hip joint: Secondary | ICD-10-CM

## 2022-11-18 DIAGNOSIS — R2689 Other abnormalities of gait and mobility: Secondary | ICD-10-CM | POA: Diagnosis not present
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
## 2022-11-25 ENCOUNTER — Ambulatory Visit: Payer: Medicare Other | Admitting: Physical Therapy

## 2022-11-25 ENCOUNTER — Encounter: Payer: Self-pay | Admitting: Physical Therapy

## 2022-11-25 DIAGNOSIS — M6281 Muscle weakness (generalized): Secondary | ICD-10-CM

## 2022-11-25 DIAGNOSIS — M7061 Trochanteric bursitis, right hip: Secondary | ICD-10-CM | POA: Diagnosis not present

## 2022-11-25 DIAGNOSIS — R2689 Other abnormalities of gait and mobility: Secondary | ICD-10-CM

## 2022-11-25 DIAGNOSIS — Z96641 Presence of right artificial hip joint: Secondary | ICD-10-CM | POA: Diagnosis not present
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
## 2022-11-30 ENCOUNTER — Ambulatory Visit: Payer: Medicare Other

## 2022-12-02 ENCOUNTER — Encounter: Payer: Medicare Other | Admitting: Physical Therapy

## 2022-12-08 ENCOUNTER — Telehealth: Payer: Self-pay | Admitting: Family Medicine

## 2022-12-08 DIAGNOSIS — M9903 Segmental and somatic dysfunction of lumbar region: Secondary | ICD-10-CM | POA: Diagnosis not present

## 2022-12-08 DIAGNOSIS — M9904 Segmental and somatic dysfunction of sacral region: Secondary | ICD-10-CM | POA: Diagnosis not present

## 2022-12-08 DIAGNOSIS — M5136 Other intervertebral disc degeneration, lumbar region: Secondary | ICD-10-CM | POA: Diagnosis not present

## 2022-12-08 DIAGNOSIS — M9905 Segmental and somatic dysfunction of pelvic region: Secondary | ICD-10-CM | POA: Diagnosis not present

## 2022-12-08 NOTE — Telephone Encounter (Signed)
Pt is calling and has been on metFORMIN (GLUCOPHAGE) 500 MG tablet for 2 months and last night she had diarrhea and stomach pain and felt hot and sweating. Pt is afraid to take medication today. Please advise

## 2022-12-09 NOTE — Telephone Encounter (Signed)
I doubt she would suddenly develop side effects from this after taking it for 2 months. Tell her to hold off it today and tomorrow, then resume taking it

## 2022-12-09 NOTE — Telephone Encounter (Signed)
Patient informed of the message below and verbalized understanding  

## 2022-12-09 NOTE — Telephone Encounter (Signed)
Left a message for the patient to return my call.  

## 2022-12-29 DIAGNOSIS — M9904 Segmental and somatic dysfunction of sacral region: Secondary | ICD-10-CM | POA: Diagnosis not present

## 2022-12-29 DIAGNOSIS — M9905 Segmental and somatic dysfunction of pelvic region: Secondary | ICD-10-CM | POA: Diagnosis not present

## 2022-12-29 DIAGNOSIS — M5136 Other intervertebral disc degeneration, lumbar region: Secondary | ICD-10-CM | POA: Diagnosis not present

## 2022-12-29 DIAGNOSIS — M9903 Segmental and somatic dysfunction of lumbar region: Secondary | ICD-10-CM | POA: Diagnosis not present

## 2023-01-12 ENCOUNTER — Ambulatory Visit: Payer: Medicare Other | Admitting: Internal Medicine

## 2023-01-18 DIAGNOSIS — M9903 Segmental and somatic dysfunction of lumbar region: Secondary | ICD-10-CM | POA: Diagnosis not present

## 2023-01-18 DIAGNOSIS — M9905 Segmental and somatic dysfunction of pelvic region: Secondary | ICD-10-CM | POA: Diagnosis not present

## 2023-01-18 DIAGNOSIS — M5136 Other intervertebral disc degeneration, lumbar region: Secondary | ICD-10-CM | POA: Diagnosis not present

## 2023-01-18 DIAGNOSIS — M9904 Segmental and somatic dysfunction of sacral region: Secondary | ICD-10-CM | POA: Diagnosis not present

## 2023-02-09 DIAGNOSIS — M9905 Segmental and somatic dysfunction of pelvic region: Secondary | ICD-10-CM | POA: Diagnosis not present

## 2023-02-09 DIAGNOSIS — M5136 Other intervertebral disc degeneration, lumbar region: Secondary | ICD-10-CM | POA: Diagnosis not present

## 2023-02-09 DIAGNOSIS — M9903 Segmental and somatic dysfunction of lumbar region: Secondary | ICD-10-CM | POA: Diagnosis not present

## 2023-02-09 DIAGNOSIS — M9904 Segmental and somatic dysfunction of sacral region: Secondary | ICD-10-CM | POA: Diagnosis not present

## 2023-03-02 DIAGNOSIS — M9901 Segmental and somatic dysfunction of cervical region: Secondary | ICD-10-CM | POA: Diagnosis not present

## 2023-03-02 DIAGNOSIS — M50322 Other cervical disc degeneration at C5-C6 level: Secondary | ICD-10-CM | POA: Diagnosis not present

## 2023-03-24 DIAGNOSIS — M9901 Segmental and somatic dysfunction of cervical region: Secondary | ICD-10-CM | POA: Diagnosis not present

## 2023-03-24 DIAGNOSIS — M50322 Other cervical disc degeneration at C5-C6 level: Secondary | ICD-10-CM | POA: Diagnosis not present

## 2023-03-29 ENCOUNTER — Telehealth: Payer: Self-pay | Admitting: Family Medicine

## 2023-03-29 NOTE — Telephone Encounter (Addendum)
Pt may possibly have Diabetes  Care Consideration - Adding a Statin   Reevaluate therapy and add statin, if clinically appropriate  Send new Rx to pharmacy ASAP, to close open care opportunity   If member does not tolerate or meet criteria, please document ICD10 diagnosis code and record in chart  Fax to follow

## 2023-03-31 NOTE — Telephone Encounter (Signed)
Call in Lipitor 10 mg daily, #90 with 3 rf

## 2023-04-02 ENCOUNTER — Other Ambulatory Visit: Payer: Self-pay

## 2023-04-02 MED ORDER — ATORVASTATIN CALCIUM 10 MG PO TABS: 10.0000 mg | ORAL_TABLET | Freq: Every day | ORAL | 3 refills | Status: DC

## 2023-04-02 NOTE — Telephone Encounter (Signed)
Pr Rx sent. Pt notified

## 2023-04-02 NOTE — Telephone Encounter (Signed)
Pt is calling and would like to know since it has been over 6 month since she had her chole check pt would like to know if she should get her chole check before starting chole med

## 2023-04-06 ENCOUNTER — Telehealth: Payer: Self-pay | Admitting: Family Medicine

## 2023-04-06 NOTE — Telephone Encounter (Signed)
Christy Douglas with [employer] called and states that the manufacturer is changing for Levothyroxine and they need approval from MD. Call back number: [phone]

## 2023-04-08 NOTE — Telephone Encounter (Signed)
Called pt left detailed message regarding lipid lab repeat.

## 2023-04-09 NOTE — Telephone Encounter (Signed)
Christy Douglas [facility] is calling and the reference number is [id number]

## 2023-04-09 NOTE — Telephone Encounter (Signed)
Please advise 

## 2023-04-12 NOTE — Telephone Encounter (Signed)
Yes that change is okay

## 2023-04-12 NOTE — Telephone Encounter (Signed)
Spoke with [facility] advised that Dr Clent Ridges approved the change

## 2023-04-13 DIAGNOSIS — M9901 Segmental and somatic dysfunction of cervical region: Secondary | ICD-10-CM | POA: Diagnosis not present

## 2023-04-13 DIAGNOSIS — M50322 Other cervical disc degeneration at C5-C6 level: Secondary | ICD-10-CM | POA: Diagnosis not present

## 2023-04-20 ENCOUNTER — Ambulatory Visit (INDEPENDENT_AMBULATORY_CARE_PROVIDER_SITE_OTHER): Payer: Medicare Other | Admitting: Family Medicine

## 2023-04-20 ENCOUNTER — Ambulatory Visit: Payer: Medicare Other | Admitting: Family Medicine

## 2023-04-20 ENCOUNTER — Encounter: Payer: Self-pay | Admitting: Family Medicine

## 2023-04-20 VITALS — BP 118/78 | HR 73 | Temp 98.6°F | Wt 148.0 lb

## 2023-04-20 DIAGNOSIS — Z23 Encounter for immunization: Secondary | ICD-10-CM | POA: Diagnosis not present

## 2023-04-20 DIAGNOSIS — E785 Hyperlipidemia, unspecified: Secondary | ICD-10-CM | POA: Insufficient documentation

## 2023-04-20 DIAGNOSIS — R739 Hyperglycemia, unspecified: Secondary | ICD-10-CM | POA: Diagnosis not present

## 2023-04-20 LAB — LIPID PANEL
Cholesterol: 196 mg/dL (ref 0–200)
HDL: 88.4 mg/dL (ref >39.00)
LDL Cholesterol: 89 mg/dL (ref 0–99)
NonHDL: 107.48
Total CHOL/HDL Ratio: 2
Triglycerides: 94 mg/dL (ref 0.0–149.0)
VLDL: 18.8 mg/dL (ref 0.0–40.0)

## 2023-04-20 LAB — HEPATIC FUNCTION PANEL
ALT: 21 U/L (ref 0–35)
AST: 26 U/L (ref 0–37)
Albumin: 4.1 g/dL (ref 3.5–5.2)
Alkaline Phosphatase: 62 U/L (ref 39–117)
Bilirubin, Direct: 0.1 mg/dL (ref 0.0–0.3)
Total Bilirubin: 0.4 mg/dL (ref 0.2–1.2)
Total Protein: 7.2 g/dL (ref 6.0–8.3)

## 2023-04-20 LAB — HEMOGLOBIN A1C: Hgb A1c MFr Bld: 5.5 % (ref 4.6–6.5)

## 2023-04-20 NOTE — Progress Notes (Signed)
Subjective:    Patient ID: Christy Douglas, female    DOB: 02-02-1956, 67 y.o.   MRN: [id number]  HPI Here to follow up from labs in February. Her LDL then was 127 and her A1c was 5.6%. we started her on Atorvastatin at that time, but she stopped taking this after 2 days because her daughter told her this had too manby side effects. She has been watching her diet closely. She feels fine.    Review of Systems  Constitutional: Negative.   Respiratory: Negative.    Cardiovascular: Negative.        Objective:   Physical Exam Constitutional:      Appearance: Normal appearance.  Cardiovascular:     Rate and Rhythm: Normal rate and regular rhythm.     Pulses: Normal pulses.     Heart sounds: Normal heart sounds.  Pulmonary:     Effort: Pulmonary effort is normal.     Breath sounds: Normal breath sounds.  Neurological:     Mental Status: She is alert.           Assessment & Plan:  Dyslipidemia and hyperglycemia. We will get fasting lipids and an A1c today. Gershon Crane, MD

## 2023-04-20 NOTE — Addendum Note (Signed)
Addended by: Carola Rhine on: 04/20/2023 11:49 AM   Modules accepted: Orders

## 2023-04-21 DIAGNOSIS — H2513 Age-related nuclear cataract, bilateral: Secondary | ICD-10-CM | POA: Diagnosis not present

## 2023-04-21 DIAGNOSIS — H4720 Unspecified optic atrophy: Secondary | ICD-10-CM | POA: Diagnosis not present

## 2023-04-26 DIAGNOSIS — Z1231 Encounter for screening mammogram for malignant neoplasm of breast: Secondary | ICD-10-CM | POA: Diagnosis not present

## 2023-05-06 DIAGNOSIS — M50322 Other cervical disc degeneration at C5-C6 level: Secondary | ICD-10-CM | POA: Diagnosis not present

## 2023-05-06 DIAGNOSIS — M9901 Segmental and somatic dysfunction of cervical region: Secondary | ICD-10-CM | POA: Diagnosis not present

## 2023-05-25 DIAGNOSIS — M50322 Other cervical disc degeneration at C5-C6 level: Secondary | ICD-10-CM | POA: Diagnosis not present

## 2023-05-25 DIAGNOSIS — M9901 Segmental and somatic dysfunction of cervical region: Secondary | ICD-10-CM | POA: Diagnosis not present

## 2023-06-02 DIAGNOSIS — D2271 Melanocytic nevi of right lower limb, including hip: Secondary | ICD-10-CM | POA: Diagnosis not present

## 2023-06-02 DIAGNOSIS — L84 Corns and callosities: Secondary | ICD-10-CM | POA: Diagnosis not present

## 2023-06-09 ENCOUNTER — Encounter: Payer: Self-pay | Admitting: Podiatry

## 2023-06-09 ENCOUNTER — Ambulatory Visit: Payer: Medicare Other | Admitting: Podiatry

## 2023-06-09 DIAGNOSIS — S92911A Unspecified fracture of right toe(s), initial encounter for closed fracture: Secondary | ICD-10-CM | POA: Insufficient documentation

## 2023-06-09 DIAGNOSIS — S92404S Nondisplaced unspecified fracture of right great toe, sequela: Secondary | ICD-10-CM | POA: Diagnosis not present

## 2023-06-09 DIAGNOSIS — L84 Corns and callosities: Secondary | ICD-10-CM
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
## 2023-06-15 DIAGNOSIS — M9901 Segmental and somatic dysfunction of cervical region: Secondary | ICD-10-CM | POA: Diagnosis not present

## 2023-06-15 DIAGNOSIS — M50322 Other cervical disc degeneration at C5-C6 level: Secondary | ICD-10-CM | POA: Diagnosis not present

## 2023-06-17 ENCOUNTER — Other Ambulatory Visit: Payer: Self-pay | Admitting: Family Medicine

## 2023-06-23 ENCOUNTER — Other Ambulatory Visit: Payer: Self-pay | Admitting: Family Medicine

## 2023-07-07 DIAGNOSIS — M50322 Other cervical disc degeneration at C5-C6 level: Secondary | ICD-10-CM | POA: Diagnosis not present

## 2023-07-07 DIAGNOSIS — M9901 Segmental and somatic dysfunction of cervical region: Secondary | ICD-10-CM | POA: Diagnosis not present

## 2023-07-19 DIAGNOSIS — M50322 Other cervical disc degeneration at C5-C6 level: Secondary | ICD-10-CM | POA: Diagnosis not present

## 2023-07-19 DIAGNOSIS — M9901 Segmental and somatic dysfunction of cervical region: Secondary | ICD-10-CM | POA: Diagnosis not present

## 2023-08-02 DIAGNOSIS — M9901 Segmental and somatic dysfunction of cervical region: Secondary | ICD-10-CM | POA: Diagnosis not present

## 2023-08-02 DIAGNOSIS — M50322 Other cervical disc degeneration at C5-C6 level: Secondary | ICD-10-CM | POA: Diagnosis not present

## 2023-08-10 DIAGNOSIS — M9901 Segmental and somatic dysfunction of cervical region: Secondary | ICD-10-CM | POA: Diagnosis not present

## 2023-08-10 DIAGNOSIS — M50322 Other cervical disc degeneration at C5-C6 level: Secondary | ICD-10-CM | POA: Diagnosis not present

## 2023-08-24 DIAGNOSIS — M9901 Segmental and somatic dysfunction of cervical region: Secondary | ICD-10-CM | POA: Diagnosis not present

## 2023-08-24 DIAGNOSIS — M50322 Other cervical disc degeneration at C5-C6 level: Secondary | ICD-10-CM | POA: Diagnosis not present

## 2023-09-06 ENCOUNTER — Ambulatory Visit: Payer: Medicare Other | Admitting: Podiatry

## 2023-09-06 ENCOUNTER — Encounter: Payer: Self-pay | Admitting: Podiatry

## 2023-09-06 DIAGNOSIS — M7752 Other enthesopathy of left foot: Secondary | ICD-10-CM

## 2023-09-06 MED ORDER — TRIAMCINOLONE ACETONIDE 10 MG/ML IJ SUSP
10.0000 mg | Freq: Once | INTRAMUSCULAR | Status: AC
Start: 2023-09-06 — End: 2023-09-06
  Administered 2023-09-06: 10 mg via INTRA_ARTICULAR

## 2023-09-06 NOTE — Progress Notes (Signed)
Subjective:   Patient ID: Christy Douglas, female   DOB: 68 y.o.   MRN: [id number]   HPI Patient presents stating that she has a lot of inflammation on the side of her left foot that has developed a secondary corn.  States it feels like she is walking on fluid   ROS      Objective:  Physical Exam  Neurovascular status intact inflammation fluid around the fifth MPJ left lesion formation present     Assessment:  Chronic inflammatory capsulitis fifth MPJ left fluid buildup around the joint with porokeratotic secondary lesion     Plan:  Reviewed sterile prep injected the capsule 3 mg Dexasone Kenalog 5 mg Xylocaine 2 courtesy debridement of lesion reappoint as needed

## 2023-09-07 DIAGNOSIS — M9904 Segmental and somatic dysfunction of sacral region: Secondary | ICD-10-CM | POA: Diagnosis not present

## 2023-09-07 DIAGNOSIS — M9903 Segmental and somatic dysfunction of lumbar region: Secondary | ICD-10-CM | POA: Diagnosis not present

## 2023-09-07 DIAGNOSIS — M5136 Other intervertebral disc degeneration, lumbar region with discogenic back pain only: Secondary | ICD-10-CM | POA: Diagnosis not present

## 2023-09-07 DIAGNOSIS — M9905 Segmental and somatic dysfunction of pelvic region: Secondary | ICD-10-CM | POA: Diagnosis not present

## 2023-09-10 ENCOUNTER — Ambulatory Visit (INDEPENDENT_AMBULATORY_CARE_PROVIDER_SITE_OTHER): Payer: Medicare Other

## 2023-09-10 VITALS — Ht 63.0 in | Wt 141.0 lb

## 2023-09-10 DIAGNOSIS — Z0000 Encounter for general adult medical examination without abnormal findings: Secondary | ICD-10-CM | POA: Diagnosis not present

## 2023-09-10 NOTE — Patient Instructions (Addendum)
 Christy Douglas , Thank you for taking time to come for your Medicare Wellness Visit. I appreciate your ongoing commitment to your health goals. Please review the following plan we discussed and let me know if I can assist you in the future.   Referrals/Orders/Follow-Ups/Clinician Recommendations: Continue to exercise daily.  This is a list of the screening recommended for you and due dates:  Health Maintenance  Topic Date Due   HIV Screening  Never done   Hepatitis C Screening  Never done   Pap with HPV screening  Never done   Pneumonia Vaccine (1 of 1 - PCV) Never done   COVID-19 Vaccine (5 - 2024-25 season) 04/04/2023   Mammogram  04/22/2024   Medicare Annual Wellness Visit  09/09/2024   Colon Cancer Screening  02/21/2030   DTaP/Tdap/Td vaccine (2 - Td or Tdap) 09/14/2032   Flu Shot  Completed   DEXA scan (bone density measurement)  Completed   Zoster (Shingles) Vaccine  Completed   HPV Vaccine  Aged Out   Opioid Pain Medicine Management Opioids are powerful medicines that are used to treat moderate to severe pain. When used for short periods of time, they can help you to: Sleep better. Do better in physical or occupational therapy. Feel better in the first few days after an injury. Recover from surgery. Opioids should be taken with the supervision of a trained health care provider. They should be taken for the shortest period of time possible. This is because opioids can be addictive, and the longer you take opioids, the greater your risk of addiction. This addiction can also be called opioid use disorder. What are the risks? Using opioid pain medicines for longer than 3 days increases your risk of side effects. Side effects include: Constipation. Nausea and vomiting. Breathing difficulties (respiratory depression). Drowsiness. Confusion. Opioid use disorder. Itching. Taking opioid pain medicine for a long period of time can affect your ability to do daily tasks. It also puts you  at risk for: Motor vehicle crashes. Depression. Suicide. Heart attack. Overdose, which can be life-threatening. What is a pain treatment plan? A pain treatment plan is an agreement between you and your health care provider. Pain is unique to each person, and treatments vary depending on your condition. To manage your pain, you and your health care provider need to work together. To help you do this: Discuss the goals of your treatment, including how much pain you might expect to have and how you will manage the pain. Review the risks and benefits of taking opioid medicines. Remember that a good treatment plan uses more than one approach and minimizes the chance of side effects. Be honest about the amount of medicines you take and about any drug or alcohol  use. Get pain medicine prescriptions from only one health care provider. Pain can be managed with many types of alternative treatments. Ask your health care provider to refer you to one or more specialists who can help you manage pain through: Physical or occupational therapy. Counseling (cognitive behavioral therapy). Good nutrition. Biofeedback. Massage. Meditation. Non-opioid medicine. Following a gentle exercise program. How to use opioid pain medicine Taking medicine Take your pain medicine exactly as told by your health care provider. Take it only when you need it. If your pain gets less severe, you may take less than your prescribed dose if your health care provider approves. If you are not having pain, do nottake pain medicine unless your health care provider tells you to take it. If your  pain is severe, do nottry to treat it yourself by taking more pills than instructed on your prescription. Contact your health care provider for help. Write down the times when you take your pain medicine. It is easy to become confused while on pain medicine. Writing the time can help you avoid overdose. Take other over-the-counter or  prescription medicines only as told by your health care provider. Keeping yourself and others safe  While you are taking opioid pain medicine: Do not drive, use machinery, or power tools. Do not sign legal documents. Do not drink alcohol . Do not take sleeping pills. Do not supervise children by yourself. Do not do activities that require climbing or being in high places. Do not go to a lake, river, ocean, spa, or swimming pool. Do not share your pain medicine with anyone. Keep pain medicine in a locked cabinet or in a secure area where pets and children cannot reach it. Stopping your use of opioids If you have been taking opioid medicine for more than a few weeks, you may need to slowly decrease (taper) how much you take until you stop completely. Tapering your use of opioids can decrease your risk of symptoms of withdrawal, such as: Pain and cramping in the abdomen. Nausea. Sweating. Sleepiness. Restlessness. Uncontrollable shaking (tremors). Cravings for the medicine. Do not attempt to taper your use of opioids on your own. Talk with your health care provider about how to do this. Your health care provider may prescribe a step-down schedule based on how much medicine you are taking and how long you have been taking it. Getting rid of leftover pills Do not save any leftover pills. Get rid of leftover pills safely by: Taking the medicine to a prescription take-back program. This is usually offered by the county or law enforcement. Bringing them to a pharmacy that has a drug disposal container. Flushing them down the toilet. Check the label or package insert of your medicine to see whether this is safe to do. Throwing them out in the trash. Check the label or package insert of your medicine to see whether this is safe to do. If it is safe to throw it out, remove the medicine from the original container, put it into a sealable bag or container, and mix it with used coffee grounds, food  scraps, dirt, or cat litter before putting it in the trash. Follow these instructions at home: Activity Do exercises as told by your health care provider. Avoid activities that make your pain worse. Return to your normal activities as told by your health care provider. Ask your health care provider what activities are safe for you. General instructions You may need to take these actions to prevent or treat constipation: Drink enough fluid to keep your urine pale yellow. Take over-the-counter or prescription medicines. Eat foods that are high in fiber, such as beans, whole grains, and fresh fruits and vegetables. Limit foods that are high in fat and processed sugars, such as fried or sweet foods. Keep all follow-up visits. This is important. Where to find support If you have been taking opioids for a long time, you may benefit from receiving support for quitting from a local support group or counselor. Ask your health care provider for a referral to these resources in your area. Where to find more information Centers for Disease Control and Prevention (CDC): footballexhibition.com.br [place] Food and Drug Administration (FDA): pumpkinsearch.com.ee Get help right away if: You may have taken too much of an opioid (overdosed). Common  symptoms of an overdose: Your breathing is slower or more shallow than normal. You have a very slow heartbeat (pulse). You have slurred speech. You have nausea and vomiting. Your pupils become very small. You have other potential symptoms: You are very confused. You faint or feel like you will faint. You have cold, clammy skin. You have blue lips or fingernails. You have thoughts of harming yourself or harming others. These symptoms may represent a serious problem that is an emergency. Do not wait to see if the symptoms will go away. Get medical help right away. Call your local emergency services (911 in the [place] Do not drive yourself to the hospital.  If you ever feel like you may  hurt yourself or others, or have thoughts about taking your own life, get help right away. Go to your nearest emergency department or: Call your local emergency services (911 in the [place] Call the [facility] ((717)327-2184 in the [place] Call a suicide crisis helpline, such as the National Suicide Prevention Lifeline at (843)225-0700 or 988 in the [place] This is open 24 hours a day in the [place] If you're a Veteran: Call 988 and press 1. This is open 24 hours a day. Text the [employer] at 272 046 3588. Summary Opioid medicines can help you manage moderate to severe pain for a short period of time. A pain treatment plan is an agreement between you and your health care provider. Discuss the goals of your treatment, including how much pain you might expect to have and how you will manage the pain. If you think that you or someone else may have taken too much of an opioid, get medical help right away. This information is not intended to replace advice given to you by your health care provider. Make sure you discuss any questions you have with your health care provider. Document Revised: 04/26/2023 Document Reviewed: 10/30/2020 Elsevier Patient Education  2024 Elsevier Inc. Advanced directives: (Copy Requested) Please bring a copy of your health care power of attorney and living will to the office to be added to your chart at your convenience.  Next Medicare Annual Wellness Visit scheduled for next year: Yes
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
## 2023-09-28 DIAGNOSIS — M9903 Segmental and somatic dysfunction of lumbar region: Secondary | ICD-10-CM | POA: Diagnosis not present

## 2023-09-28 DIAGNOSIS — M9904 Segmental and somatic dysfunction of sacral region: Secondary | ICD-10-CM | POA: Diagnosis not present

## 2023-09-28 DIAGNOSIS — M9905 Segmental and somatic dysfunction of pelvic region: Secondary | ICD-10-CM | POA: Diagnosis not present

## 2023-09-28 DIAGNOSIS — M5136 Other intervertebral disc degeneration, lumbar region with discogenic back pain only: Secondary | ICD-10-CM | POA: Diagnosis not present

## 2023-11-09 DIAGNOSIS — M9904 Segmental and somatic dysfunction of sacral region: Secondary | ICD-10-CM | POA: Diagnosis not present

## 2023-11-09 DIAGNOSIS — M9905 Segmental and somatic dysfunction of pelvic region: Secondary | ICD-10-CM | POA: Diagnosis not present

## 2023-11-09 DIAGNOSIS — M5136 Other intervertebral disc degeneration, lumbar region with discogenic back pain only: Secondary | ICD-10-CM | POA: Diagnosis not present

## 2023-11-09 DIAGNOSIS — M9903 Segmental and somatic dysfunction of lumbar region: Secondary | ICD-10-CM | POA: Diagnosis not present

## 2023-11-10 DIAGNOSIS — M9905 Segmental and somatic dysfunction of pelvic region: Secondary | ICD-10-CM | POA: Diagnosis not present

## 2023-11-10 DIAGNOSIS — M9904 Segmental and somatic dysfunction of sacral region: Secondary | ICD-10-CM | POA: Diagnosis not present

## 2023-11-10 DIAGNOSIS — M9903 Segmental and somatic dysfunction of lumbar region: Secondary | ICD-10-CM | POA: Diagnosis not present

## 2023-11-10 DIAGNOSIS — M5136 Other intervertebral disc degeneration, lumbar region with discogenic back pain only: Secondary | ICD-10-CM | POA: Diagnosis not present

## 2023-11-30 DIAGNOSIS — M9903 Segmental and somatic dysfunction of lumbar region: Secondary | ICD-10-CM | POA: Diagnosis not present

## 2023-11-30 DIAGNOSIS — M5136 Other intervertebral disc degeneration, lumbar region with discogenic back pain only: Secondary | ICD-10-CM | POA: Diagnosis not present

## 2023-11-30 DIAGNOSIS — M9904 Segmental and somatic dysfunction of sacral region: Secondary | ICD-10-CM | POA: Diagnosis not present

## 2023-11-30 DIAGNOSIS — M9905 Segmental and somatic dysfunction of pelvic region: Secondary | ICD-10-CM | POA: Diagnosis not present

## 2023-12-29 DIAGNOSIS — M9905 Segmental and somatic dysfunction of pelvic region: Secondary | ICD-10-CM | POA: Diagnosis not present

## 2023-12-29 DIAGNOSIS — M5136 Other intervertebral disc degeneration, lumbar region with discogenic back pain only: Secondary | ICD-10-CM | POA: Diagnosis not present

## 2023-12-29 DIAGNOSIS — M9903 Segmental and somatic dysfunction of lumbar region: Secondary | ICD-10-CM | POA: Diagnosis not present

## 2023-12-29 DIAGNOSIS — M9904 Segmental and somatic dysfunction of sacral region: Secondary | ICD-10-CM | POA: Diagnosis not present

## 2024-01-18 DIAGNOSIS — M9905 Segmental and somatic dysfunction of pelvic region: Secondary | ICD-10-CM | POA: Diagnosis not present

## 2024-01-18 DIAGNOSIS — M5136 Other intervertebral disc degeneration, lumbar region with discogenic back pain only: Secondary | ICD-10-CM | POA: Diagnosis not present

## 2024-01-18 DIAGNOSIS — M9904 Segmental and somatic dysfunction of sacral region: Secondary | ICD-10-CM | POA: Diagnosis not present

## 2024-01-18 DIAGNOSIS — M9903 Segmental and somatic dysfunction of lumbar region: Secondary | ICD-10-CM | POA: Diagnosis not present

## 2024-01-24 ENCOUNTER — Encounter: Payer: Self-pay | Admitting: Podiatry

## 2024-01-24 ENCOUNTER — Ambulatory Visit: Admitting: Podiatry

## 2024-01-24 VITALS — Ht 63.0 in | Wt 141.0 lb

## 2024-01-24 DIAGNOSIS — M7752 Other enthesopathy of left foot: Secondary | ICD-10-CM

## 2024-01-24 DIAGNOSIS — L84 Corns and callosities: Secondary | ICD-10-CM

## 2024-01-24 MED ORDER — TRIAMCINOLONE ACETONIDE 10 MG/ML IJ SUSP
10.0000 mg | Freq: Once | INTRAMUSCULAR | Status: AC
Start: 2024-01-24 — End: 2024-01-24
  Administered 2024-01-24: 10 mg via INTRA_ARTICULAR

## 2024-01-25 NOTE — Progress Notes (Signed)
 Subjective:   Patient ID: Christy Douglas, female   DOB: 68 y.o.   MRN: [id number]   HPI Patient presents with pain again in the left fifth metatarsal fluid buildup around the joint stating she got at least 4 months of relief neuro   ROS      Objective:  Physical Exam  Vascular status intact with inflammation pain of the fifth MPJ left fluid buildup with lucent type lesion formation on the skin     Assessment:  Inflammatory capsulitis of the fifth MPJ left with pain along with lesion formation     Plan:  H&P reviewed conditions discussed and at this point I carefully injected the plantar lateral capsule 3 mg Dexasone Kenalog  5 mg Xylocaine  and did a courtesy debridement of lesion iatrogenic bleeding reappoint routine care

## 2024-02-09 DIAGNOSIS — M9904 Segmental and somatic dysfunction of sacral region: Secondary | ICD-10-CM | POA: Diagnosis not present

## 2024-02-09 DIAGNOSIS — M9905 Segmental and somatic dysfunction of pelvic region: Secondary | ICD-10-CM | POA: Diagnosis not present

## 2024-02-09 DIAGNOSIS — M5136 Other intervertebral disc degeneration, lumbar region with discogenic back pain only: Secondary | ICD-10-CM | POA: Diagnosis not present

## 2024-02-09 DIAGNOSIS — M9903 Segmental and somatic dysfunction of lumbar region: Secondary | ICD-10-CM | POA: Diagnosis not present

## 2024-02-26 ENCOUNTER — Other Ambulatory Visit: Payer: Self-pay | Admitting: Family Medicine

## 2024-02-29 DIAGNOSIS — M9905 Segmental and somatic dysfunction of pelvic region: Secondary | ICD-10-CM | POA: Diagnosis not present

## 2024-02-29 DIAGNOSIS — M9903 Segmental and somatic dysfunction of lumbar region: Secondary | ICD-10-CM | POA: Diagnosis not present

## 2024-02-29 DIAGNOSIS — M5136 Other intervertebral disc degeneration, lumbar region with discogenic back pain only: Secondary | ICD-10-CM | POA: Diagnosis not present

## 2024-02-29 DIAGNOSIS — M9904 Segmental and somatic dysfunction of sacral region: Secondary | ICD-10-CM | POA: Diagnosis not present

## 2024-03-03 ENCOUNTER — Other Ambulatory Visit: Payer: Self-pay | Admitting: Family Medicine

## 2024-03-13 DIAGNOSIS — M9904 Segmental and somatic dysfunction of sacral region: Secondary | ICD-10-CM | POA: Diagnosis not present

## 2024-03-13 DIAGNOSIS — M9903 Segmental and somatic dysfunction of lumbar region: Secondary | ICD-10-CM | POA: Diagnosis not present

## 2024-03-13 DIAGNOSIS — M5136 Other intervertebral disc degeneration, lumbar region with discogenic back pain only: Secondary | ICD-10-CM | POA: Diagnosis not present

## 2024-03-13 DIAGNOSIS — M9905 Segmental and somatic dysfunction of pelvic region: Secondary | ICD-10-CM | POA: Diagnosis not present

## 2024-04-04 DIAGNOSIS — M9904 Segmental and somatic dysfunction of sacral region: Secondary | ICD-10-CM | POA: Diagnosis not present

## 2024-04-04 DIAGNOSIS — M9905 Segmental and somatic dysfunction of pelvic region: Secondary | ICD-10-CM | POA: Diagnosis not present

## 2024-04-04 DIAGNOSIS — M9903 Segmental and somatic dysfunction of lumbar region: Secondary | ICD-10-CM | POA: Diagnosis not present

## 2024-04-04 DIAGNOSIS — M5136 Other intervertebral disc degeneration, lumbar region with discogenic back pain only: Secondary | ICD-10-CM | POA: Diagnosis not present

## 2024-04-18 DIAGNOSIS — M9903 Segmental and somatic dysfunction of lumbar region: Secondary | ICD-10-CM | POA: Diagnosis not present

## 2024-04-18 DIAGNOSIS — M9904 Segmental and somatic dysfunction of sacral region: Secondary | ICD-10-CM | POA: Diagnosis not present

## 2024-04-18 DIAGNOSIS — M5136 Other intervertebral disc degeneration, lumbar region with discogenic back pain only: Secondary | ICD-10-CM | POA: Diagnosis not present

## 2024-04-18 DIAGNOSIS — M9905 Segmental and somatic dysfunction of pelvic region: Secondary | ICD-10-CM | POA: Diagnosis not present

## 2024-04-27 DIAGNOSIS — H2513 Age-related nuclear cataract, bilateral: Secondary | ICD-10-CM | POA: Diagnosis not present

## 2024-04-27 DIAGNOSIS — H04123 Dry eye syndrome of bilateral lacrimal glands: Secondary | ICD-10-CM | POA: Diagnosis not present

## 2024-04-27 DIAGNOSIS — H524 Presbyopia: Secondary | ICD-10-CM | POA: Diagnosis not present

## 2024-04-27 DIAGNOSIS — H43813 Vitreous degeneration, bilateral: Secondary | ICD-10-CM | POA: Diagnosis not present

## 2024-04-27 DIAGNOSIS — H52202 Unspecified astigmatism, left eye: Secondary | ICD-10-CM | POA: Diagnosis not present

## 2024-04-27 DIAGNOSIS — H40023 Open angle with borderline findings, high risk, bilateral: Secondary | ICD-10-CM | POA: Diagnosis not present

## 2024-04-27 DIAGNOSIS — H25013 Cortical age-related cataract, bilateral: Secondary | ICD-10-CM | POA: Diagnosis not present

## 2024-04-27 DIAGNOSIS — H4720 Unspecified optic atrophy: Secondary | ICD-10-CM | POA: Diagnosis not present

## 2024-05-02 DIAGNOSIS — M816 Localized osteoporosis [Lequesne]: Secondary | ICD-10-CM | POA: Diagnosis not present

## 2024-05-02 DIAGNOSIS — Z1231 Encounter for screening mammogram for malignant neoplasm of breast: Secondary | ICD-10-CM | POA: Diagnosis not present

## 2024-05-09 DIAGNOSIS — M9903 Segmental and somatic dysfunction of lumbar region: Secondary | ICD-10-CM | POA: Diagnosis not present

## 2024-05-09 DIAGNOSIS — M5136 Other intervertebral disc degeneration, lumbar region with discogenic back pain only: Secondary | ICD-10-CM | POA: Diagnosis not present

## 2024-05-09 DIAGNOSIS — M9904 Segmental and somatic dysfunction of sacral region: Secondary | ICD-10-CM | POA: Diagnosis not present

## 2024-05-09 DIAGNOSIS — M9905 Segmental and somatic dysfunction of pelvic region: Secondary | ICD-10-CM | POA: Diagnosis not present

## 2024-06-12 ENCOUNTER — Telehealth: Payer: Self-pay | Admitting: Family Medicine

## 2024-06-12 NOTE — Telephone Encounter (Signed)
-----   Message from Jon VEAR Lindau sent at 06/10/2024  2:29 PM EST ----- Hello,  Patient is due for a CPE with PCP.  Can someone attempt to outreach patient to schedule?  Thank you! Jon VEAR Lindau, PharmD Clinical Pharmacist [phone]

## 2024-06-12 NOTE — Telephone Encounter (Signed)
 Lmom asking pt to callback to sch cpe

## 2024-06-21 NOTE — Telephone Encounter (Signed)
Pt has been sch

## 2024-07-04 ENCOUNTER — Encounter: Admitting: Family Medicine

## 2024-07-05 ENCOUNTER — Ambulatory Visit: Admitting: Family Medicine

## 2024-07-05 VITALS — BP 124/80 | HR 76 | Temp 98.4°F | Ht 62.5 in | Wt 141.8 lb

## 2024-07-05 DIAGNOSIS — R739 Hyperglycemia, unspecified: Secondary | ICD-10-CM | POA: Diagnosis not present

## 2024-07-05 DIAGNOSIS — E039 Hypothyroidism, unspecified: Secondary | ICD-10-CM

## 2024-07-05 DIAGNOSIS — E210 Primary hyperparathyroidism: Secondary | ICD-10-CM

## 2024-07-05 DIAGNOSIS — M810 Age-related osteoporosis without current pathological fracture: Secondary | ICD-10-CM

## 2024-07-05 DIAGNOSIS — E559 Vitamin D deficiency, unspecified: Secondary | ICD-10-CM

## 2024-07-05 DIAGNOSIS — E785 Hyperlipidemia, unspecified: Secondary | ICD-10-CM

## 2024-07-05 DIAGNOSIS — J301 Allergic rhinitis due to pollen: Secondary | ICD-10-CM

## 2024-07-05 MED ORDER — METHYLPREDNISOLONE ACETATE 80 MG/ML IJ SUSP
80.0000 mg | Freq: Once | INTRAMUSCULAR | Status: AC
  Administered 2024-07-05: 80 mg via INTRAMUSCULAR

## 2024-07-05 MED ORDER — METHYLPREDNISOLONE ACETATE 40 MG/ML IJ SUSP
40.0000 mg | Freq: Once | INTRAMUSCULAR | Status: AC
  Administered 2024-07-05: 40 mg via INTRAMUSCULAR

## 2024-07-05 MED ORDER — METFORMIN HCL 500 MG PO TABS: 500.0000 mg | ORAL_TABLET | Freq: Every morning | ORAL | Status: AC

## 2024-07-05 NOTE — Progress Notes (Signed)
 Subjective:    Patient ID: Christy Douglas, female    DOB: [birth year], 68 y.o.   MRN: [id number]  HPI Here to follow up on issues. Her main complaint today is 2 weeks of stuffy sinuses and itchy eyes. No fever or ST or cough. She has been taking Allegra for this. Otherwise she takes Levothyroxine  for hypothyroidism. She takes 2000 units of vitamin D  daily. She has hypercalcemia, and this has been steady the past few years. She last saw Dr. Sam in 2021. She has osteoporosis, and her last DEXA was last year.    Review of Systems  Constitutional: Negative.   HENT:  Positive for congestion and sinus pressure. Negative for ear pain, postnasal drip and sore throat.   Eyes: Negative.   Respiratory: Negative.    Cardiovascular: Negative.   Gastrointestinal: Negative.   Genitourinary:  Negative for decreased urine volume, difficulty urinating, dyspareunia, dysuria, enuresis, flank pain, frequency, hematuria, pelvic pain and urgency.  Musculoskeletal: Negative.   Skin: Negative.   Neurological: Negative.  Negative for headaches.  Psychiatric/Behavioral: Negative.         Objective:   Physical Exam Constitutional:      General: She is not in acute distress.    Appearance: Normal appearance. She is well-developed.  HENT:     Head: Normocephalic and atraumatic.     Right Ear: External ear normal.     Left Ear: External ear normal.     Nose: Nose normal.     Mouth/Throat:     Pharynx: No oropharyngeal exudate.  Eyes:     General: No scleral icterus.    Conjunctiva/sclera: Conjunctivae normal.     Pupils: Pupils are equal, round, and reactive to light.  Neck:     Thyroid : No thyromegaly.     Vascular: No JVD.  Cardiovascular:     Rate and Rhythm: Normal rate and regular rhythm.     Pulses: Normal pulses.     Heart sounds: Normal heart sounds. No murmur heard.    No friction rub. No gallop.  Pulmonary:     Effort: Pulmonary effort is normal. No respiratory distress.      Breath sounds: Normal breath sounds. No wheezing or rales.  Chest:     Chest wall: No tenderness.  Abdominal:     General: Bowel sounds are normal. There is no distension.     Palpations: Abdomen is soft. There is no mass.     Tenderness: There is no abdominal tenderness. There is no guarding or rebound.  Musculoskeletal:        General: No tenderness. Normal range of motion.     Cervical back: Normal range of motion and neck supple.  Lymphadenopathy:     Cervical: No cervical adenopathy.  Skin:    General: Skin is warm and dry.     Findings: No erythema or rash.  Neurological:     General: No focal deficit present.     Mental Status: She is alert and oriented to person, place, and time.     Cranial Nerves: No cranial nerve deficit.     Motor: No abnormal muscle tone.     Coordination: Coordination normal.     Deep Tendon Reflexes: Reflexes are normal and symmetric. Reflexes normal.  Psychiatric:        Mood and Affect: Mood normal.        Behavior: Behavior normal.        Thought Content: Thought content normal.  Judgment: Judgment normal.           Assessment & Plan:  She is dealing with seasonal allergies and we wil give her a shot of DepoMedrol today to quiet these down. She will use Allegra as needed. We will get labs to check a thyroid  panel, calcium , vitamin D , etc. I personally spent a total of 32 minutes in the care of the patient today including getting/reviewing separately obtained history, performing a medically appropriate exam/evaluation, and placing orders.  Garnette Olmsted, MD

## 2024-07-05 NOTE — Addendum Note (Signed)
 Addended by: LADONNA INOCENTE SAILOR on: 07/05/2024 03:55 PM   Modules accepted: Orders

## 2024-07-06 ENCOUNTER — Ambulatory Visit: Payer: Self-pay | Admitting: Family Medicine

## 2024-07-06 LAB — HEPATIC FUNCTION PANEL
ALT: 22 U/L (ref 0–35)
AST: 27 U/L (ref 0–37)
Albumin: 4.8 g/dL (ref 3.5–5.2)
Alkaline Phosphatase: 61 U/L (ref 39–117)
Bilirubin, Direct: 0 mg/dL (ref 0.0–0.3)
Total Bilirubin: 0.4 mg/dL (ref 0.2–1.2)
Total Protein: 7.8 g/dL (ref 6.0–8.3)

## 2024-07-06 LAB — CBC WITH DIFFERENTIAL/PLATELET
Basophils Absolute: 0 K/uL (ref 0.0–0.1)
Basophils Relative: 0.4 % (ref 0.0–3.0)
Eosinophils Absolute: 0.2 K/uL (ref 0.0–0.7)
Eosinophils Relative: 3 % (ref 0.0–5.0)
HCT: 40.4 % (ref 36.0–46.0)
Hemoglobin: 13.7 g/dL (ref 12.0–15.0)
Lymphocytes Relative: 25 % (ref 12.0–46.0)
Lymphs Abs: 1.7 K/uL (ref 0.7–4.0)
MCHC: 33.8 g/dL (ref 30.0–36.0)
MCV: 92.4 fl (ref 78.0–100.0)
Monocytes Absolute: 0.4 K/uL (ref 0.1–1.0)
Monocytes Relative: 6.1 % (ref 3.0–12.0)
Neutro Abs: 4.6 K/uL (ref 1.4–7.7)
Neutrophils Relative %: 65.5 % (ref 43.0–77.0)
Platelets: 235 K/uL (ref 150.0–400.0)
RBC: 4.38 Mil/uL (ref 3.87–5.11)
RDW: 13 % (ref 11.5–15.5)
WBC: 7 K/uL (ref 4.0–10.5)

## 2024-07-06 LAB — LIPID PANEL
Cholesterol: 215 mg/dL — ABNORMAL HIGH (ref 0–200)
HDL: 85.4 mg/dL (ref >39.00)
LDL Cholesterol: 101 mg/dL — ABNORMAL HIGH (ref 0–99)
NonHDL: 129.79
Total CHOL/HDL Ratio: 3
Triglycerides: 144 mg/dL (ref 0.0–149.0)
VLDL: 28.8 mg/dL (ref 0.0–40.0)

## 2024-07-06 LAB — BASIC METABOLIC PANEL WITH GFR
BUN: 14 mg/dL (ref 6–23)
CO2: 30 meq/L (ref 19–32)
Calcium: 11.8 mg/dL — ABNORMAL HIGH (ref 8.4–10.5)
Chloride: 103 meq/L (ref 96–112)
Creatinine, Ser: 0.64 mg/dL (ref 0.40–1.20)
GFR: 92.18 mL/min (ref >60.00)
Glucose, Bld: 71 mg/dL (ref 70–99)
Potassium: 4.2 meq/L (ref 3.5–5.1)
Sodium: 139 meq/L (ref 135–145)

## 2024-07-06 LAB — T3, FREE: T3, Free: 3 pg/mL (ref 2.3–4.2)

## 2024-07-06 LAB — T4, FREE: Free T4: 0.69 ng/dL (ref 0.60–1.60)

## 2024-07-06 LAB — HEMOGLOBIN A1C: Hgb A1c MFr Bld: 5.3 % (ref 4.6–6.5)

## 2024-07-06 LAB — VITAMIN D 25 HYDROXY (VIT D DEFICIENCY, FRACTURES): VITD: 45.82 ng/mL (ref 30.00–100.00)

## 2024-07-06 LAB — TSH: TSH: 2.16 u[IU]/mL (ref 0.35–5.50)

## 2024-07-06 NOTE — Addendum Note (Signed)
 Addended by: JOHNNY SENIOR A on: 07/06/2024 12:25 PM   Modules accepted: Orders

## 2024-07-13 ENCOUNTER — Other Ambulatory Visit: Payer: Self-pay

## 2024-07-18 ENCOUNTER — Telehealth: Payer: Self-pay | Admitting: *Deleted

## 2024-07-18 NOTE — Telephone Encounter (Signed)
 Left pt a detailed message to call the office back regarding message

## 2024-07-18 NOTE — Telephone Encounter (Signed)
 Reviewed lab results with pt agrees to referral with Dr. Sam. Ok to place referral

## 2024-07-18 NOTE — Telephone Encounter (Signed)
 Copied from CRM #[id number]. Topic: Clinical - Lab/Test Results >> Jul 13, 2024 10:32 AM Carlyon D wrote: Reason for CRM: Pt is calling in regards to her lab work and having to go see another provider, she is asking that Curtistine give her a call back to further discuss

## 2024-07-18 NOTE — Telephone Encounter (Signed)
 I did the referral on 07-06-24

## 2024-07-19 NOTE — Telephone Encounter (Signed)
 Pt is aware she will get a call to schedule

## 2024-09-14 ENCOUNTER — Encounter: Admitting: Orthopedic Surgery

## 2024-09-15 ENCOUNTER — Ambulatory Visit: Payer: Medicare Other

## 2024-11-07 ENCOUNTER — Ambulatory Visit: Admitting: "Endocrinology

## 2024-11-09 ENCOUNTER — Ambulatory Visit
